# Patient Record
Sex: Female | Born: 1955 | Race: White | Hispanic: No | Marital: Married | Smoking: Current every day smoker
Health system: Southern US, Community
[De-identification: ages and names within clinical notes are randomized; demographics above are authoritative.]

## PROBLEM LIST (undated history)

## (undated) DIAGNOSIS — F1990 Other psychoactive substance use, unspecified, uncomplicated: Secondary | ICD-10-CM

## (undated) DIAGNOSIS — I10 Essential (primary) hypertension: Secondary | ICD-10-CM

## (undated) DIAGNOSIS — F32A Depression, unspecified: Secondary | ICD-10-CM

## (undated) DIAGNOSIS — M1990 Unspecified osteoarthritis, unspecified site: Secondary | ICD-10-CM

## (undated) DIAGNOSIS — F319 Bipolar disorder, unspecified: Secondary | ICD-10-CM

## (undated) DIAGNOSIS — Z720 Tobacco use: Secondary | ICD-10-CM

## (undated) DIAGNOSIS — I639 Cerebral infarction, unspecified: Secondary | ICD-10-CM

## (undated) DIAGNOSIS — E119 Type 2 diabetes mellitus without complications: Secondary | ICD-10-CM

## (undated) DIAGNOSIS — G8929 Other chronic pain: Secondary | ICD-10-CM

## (undated) DIAGNOSIS — E785 Hyperlipidemia, unspecified: Secondary | ICD-10-CM

## (undated) DIAGNOSIS — M4802 Spinal stenosis, cervical region: Secondary | ICD-10-CM

## (undated) HISTORY — DX: Essential (primary) hypertension: I10

## (undated) HISTORY — DX: Bipolar disorder, unspecified: F31.9

## (undated) HISTORY — DX: Other psychoactive substance use, unspecified, uncomplicated: F19.90

## (undated) HISTORY — DX: Type 2 diabetes mellitus without complications: E11.9

## (undated) HISTORY — DX: Hyperlipidemia, unspecified: E78.5

## (undated) HISTORY — DX: Depression, unspecified: F32.A

## (undated) HISTORY — PX: CHOLECYSTECTOMY: SHX55

## (undated) HISTORY — DX: Cerebral infarction, unspecified: I63.9

## (undated) HISTORY — DX: Unspecified osteoarthritis, unspecified site: M19.90

## (undated) HISTORY — DX: Other chronic pain: G89.29

## (undated) HISTORY — DX: Tobacco use: Z72.0

## (undated) HISTORY — PX: VAGINAL HYSTERECTOMY: SUR661

## (undated) HISTORY — DX: Spinal stenosis, cervical region: M48.02

---

## 2016-01-02 ENCOUNTER — Other Ambulatory Visit (HOSPITAL_COMMUNITY): Payer: Self-pay | Admitting: Neurosurgery

## 2016-01-02 ENCOUNTER — Other Ambulatory Visit: Payer: Self-pay | Admitting: Neurosurgery

## 2016-01-02 DIAGNOSIS — M4722 Other spondylosis with radiculopathy, cervical region: Secondary | ICD-10-CM

## 2016-01-21 ENCOUNTER — Ambulatory Visit (HOSPITAL_COMMUNITY)
Admission: RE | Admit: 2016-01-21 | Discharge: 2016-01-21 | Disposition: A | Discharge status: Home / Self Care | Payer: BLUE CROSS/BLUE SHIELD | Source: Ambulatory Visit | Attending: Neurosurgery | Admitting: Neurosurgery

## 2016-01-21 ENCOUNTER — Encounter (HOSPITAL_COMMUNITY): Payer: Self-pay | Admitting: *Deleted

## 2016-01-21 DIAGNOSIS — G542 Cervical root disorders, not elsewhere classified: Secondary | ICD-10-CM | POA: Insufficient documentation

## 2016-01-21 DIAGNOSIS — M4722 Other spondylosis with radiculopathy, cervical region: Secondary | ICD-10-CM | POA: Diagnosis not present

## 2016-01-21 DIAGNOSIS — M50223 Other cervical disc displacement at C6-C7 level: Secondary | ICD-10-CM | POA: Diagnosis not present

## 2016-01-21 DIAGNOSIS — M4802 Spinal stenosis, cervical region: Secondary | ICD-10-CM | POA: Diagnosis not present

## 2016-01-21 MED ORDER — LIDOCAINE HCL (PF) 1 % IJ SOLN
10.0000 mL | Freq: Once | INTRAMUSCULAR | Status: AC
  Administered 2016-01-21: 5 mL via INTRADERMAL

## 2016-01-21 MED ORDER — DIAZEPAM 5 MG PO TABS
ORAL_TABLET | ORAL | Status: AC
  Filled 2016-01-21: qty 2

## 2016-01-21 MED ORDER — OXYCODONE HCL 5 MG PO TABS: 5.0000 mg | ORAL_TABLET | ORAL | Status: DC | PRN

## 2016-01-21 MED ORDER — DIAZEPAM 5 MG PO TABS
10.0000 mg | ORAL_TABLET | Freq: Once | ORAL | Status: AC
  Administered 2016-01-21: 10 mg via ORAL

## 2016-01-21 MED ORDER — ONDANSETRON HCL 4 MG/2ML IJ SOLN
4.0000 mg | Freq: Four times a day (QID) | INTRAMUSCULAR | Status: DC | PRN
Start: 2016-01-21 — End: 2016-01-22

## 2016-01-21 MED ORDER — LIDOCAINE HCL 1 % IJ SOLN
INTRAMUSCULAR | Status: AC
  Filled 2016-01-21: qty 10

## 2016-01-21 MED ORDER — IOHEXOL 300 MG/ML  SOLN
10.0000 mL | Freq: Once | INTRAMUSCULAR | Status: AC | PRN
  Administered 2016-01-21: 10 mL via INTRATHECAL

## 2016-01-21 NOTE — Op Note (Signed)
01/21/2016 Cervical Myelogram  PATIENT:  Teresa Garrison is a 60 y.o. female with cervical pain  PRE-OPERATIVE DIAGNOSIS:  cervicalgia  POST-OPERATIVE DIAGNOSIS:  cervicalgia  PROCEDURE:  Cervical Myelogram  SURGEON:  Skylinn Vialpando  ANESTHESIA:   local LOCAL MEDICATIONS USED:  LIDOCAINE  and Amount: 7 ml Procedure Note: [name] is a 60 y.o. female Was taken to the fluoroscopy suite and  positioned prone on the fluoroscopy table. Her back was prepared and draped in a sterile manner. I infiltrated 7 cc into the lumbar region. I then introduced a spinal needle into the thecal sac at the L3/4 interlaminar space. I infiltrated 10cc of Omnipaque 300 into the thecal sac. Fluoroscopy showed the needle and contrast in the thecal sac. Monico Blitz tolerated the procedure well. she Will be taken to CT for evaluation.     PATIENT DISPOSITION:  PACU - hemodynamically stable.

## 2016-01-21 NOTE — Discharge Instructions (Signed)
Myelography, Care After °These instructions give you information on caring for yourself after your procedure. Your doctor may also give you more specific instructions. Call your doctor if you have any problems or questions after your procedure. °HOME CARE °· Rest the first day. °· When you rest, lie flat, with your head slightly raised (elevated). °· Avoid heavy lifting and activity for 48 hours, or as told by your doctor. °· You may take the bandage (dressing) off one day after the test, or as told by your doctor. °· Take all medicines only as told by your doctor. °· Ask your doctor when it is okay to take a shower or bath. °· Ask your doctor when your test results will be ready and how you can get them. Make sure you follow up and get your results. °· Do not drink alcohol for 24 hours, or as told by your doctor. °· Drink enough fluid to keep your pee (urine) clear or pale yellow. °GET HELP IF:  °· You have a fever. °· You have a headache. °· You feel sick to your stomach (nauseous) or throw up (vomit). °· You have pain or cramping in your belly (abdomen). °GET HELP RIGHT AWAY IF:  °· You have a headache with a stiff neck or fever. °· You have trouble breathing. °· Any of the places where the needles were put in are: °¨ Puffy (swollen) or red. °¨ Sore or hot to the touch. °¨ Draining yellowish-white fluid (pus). °¨ Bleeding. °MAKE SURE YOU: °· Understand these instructions. °· Will watch your condition. °· Will get help right away if you are not doing well or get worse. °  °This information is not intended to replace advice given to you by your health care provider. Make sure you discuss any questions you have with your health care provider. °  °Document Released: 03/17/2008 Document Revised: 06/29/2014 Document Reviewed: 03/02/2012 °Elsevier Interactive Patient Education ©2016 Elsevier Inc. ° °

## 2017-04-08 IMAGING — CT CT CERVICAL SPINE W/ CM
3 of 4 series · 12 of 33 positions shown, 14 images · IV contrast (Omni 300)
Comparison: none

CLINICAL DATA: Neck and RIGHT arm pain.  Recent surgery.
TECHNIQUE: Contiguous axial images were obtained through the Cervical spine
after the intrathecal infusion of infusion. Coronal and sagittal
reconstructions were obtained of the axial image sets.

[Series 4: c-spine 2.0 (person_name) (person_name) · axial · 0.24mm/px · z∈[-454,-326]mm · 4 of 97 slices shown, 5 images]
[im 17/97  soft-tissue]
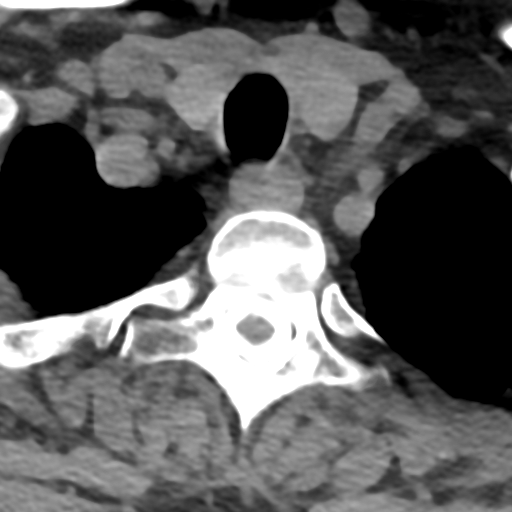
[im 17/97  bone]
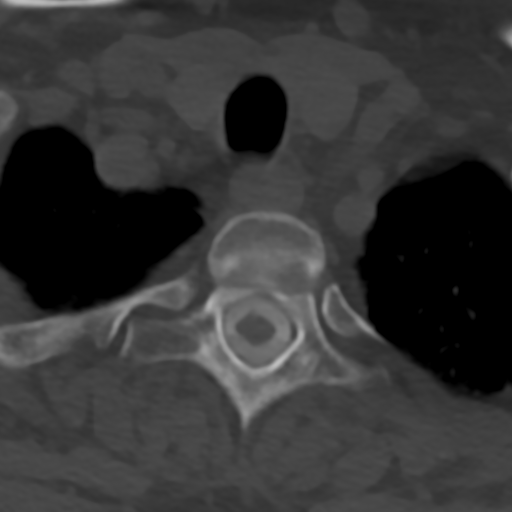
[im 33/97  bone]
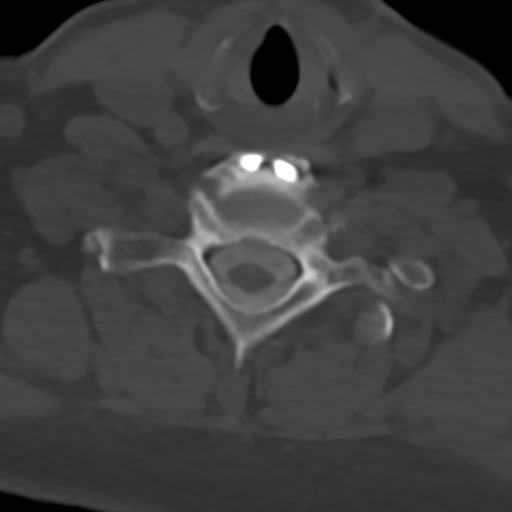
[im 65/97  bone]
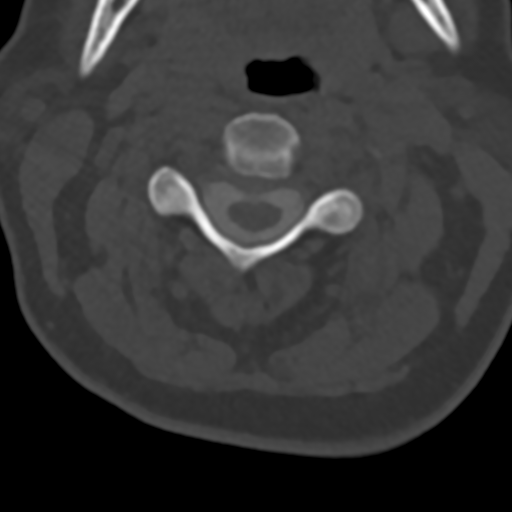
[im 81/97  bone]
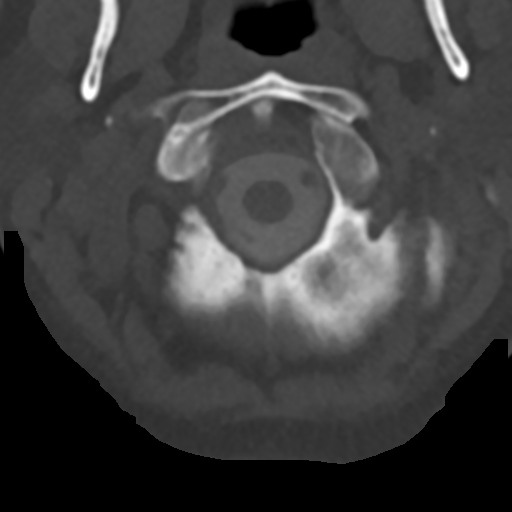

[Series 11: c-spine 2.0 cor bone · coronal · 0.28mm/px · 3 of 61 slices shown]
[im 13/61  bone]
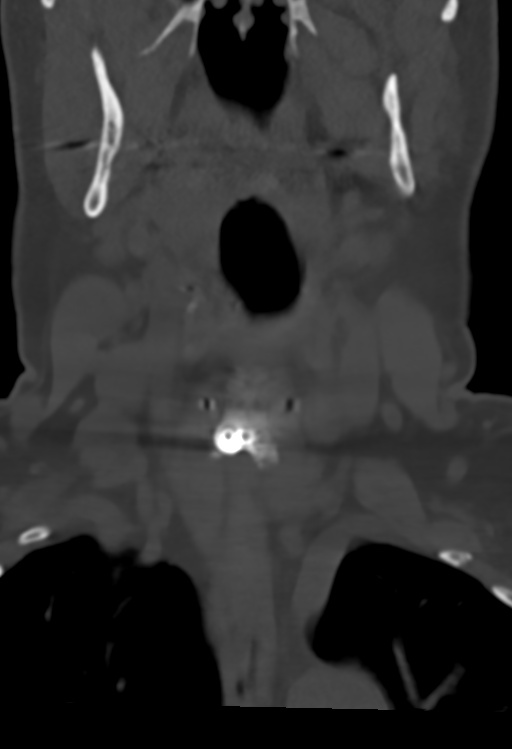
[im 25/61  bone]
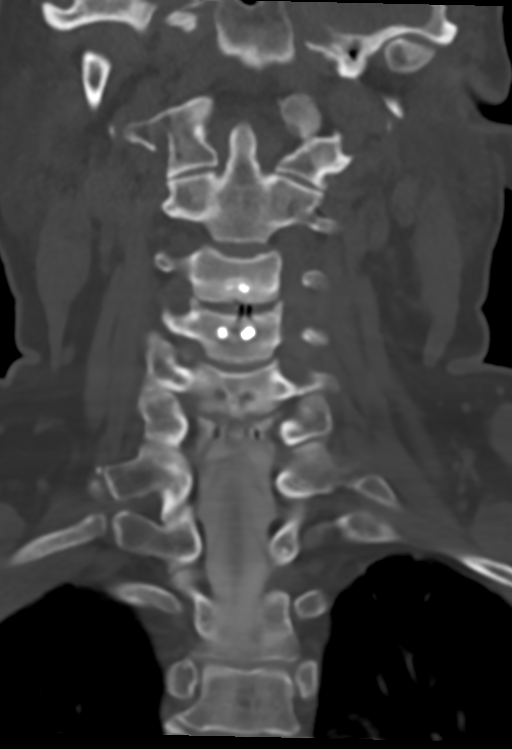
[im 37/61  bone]
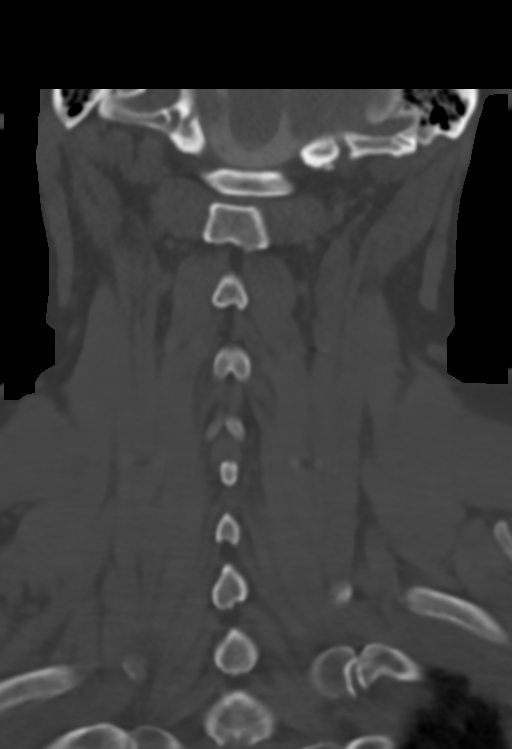

[Series 12: c-spine 2.0 sag bone · sagittal · 0.28mm/px · 5 of 50 slices shown, 6 images]
[im 17/50  bone]
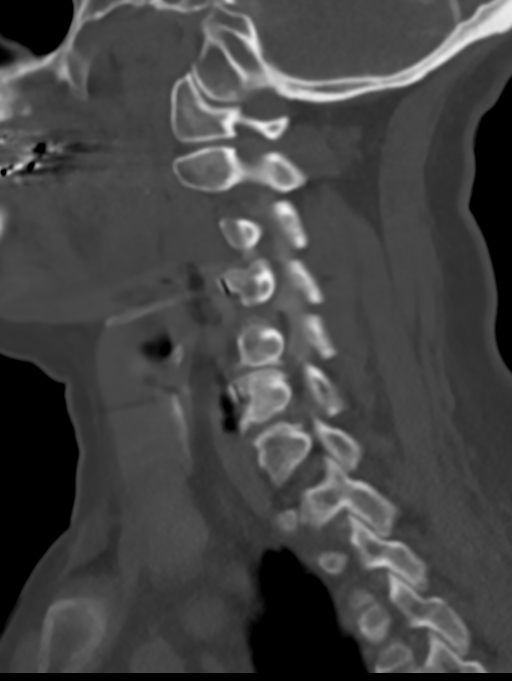
[im 21/50  bone]
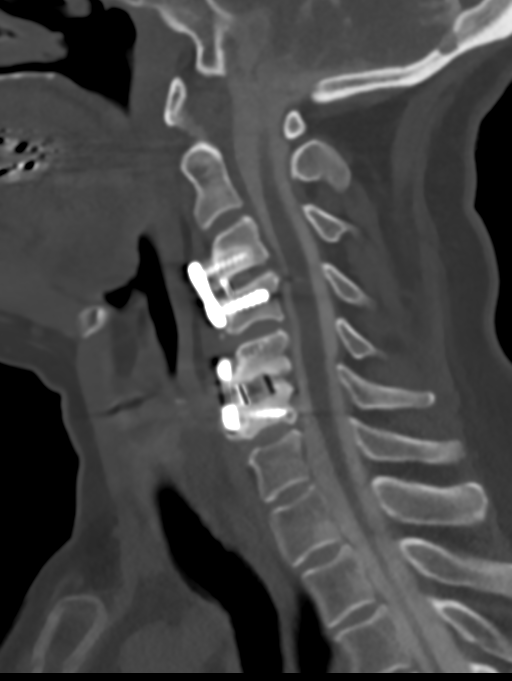
[im 25/50  soft-tissue]
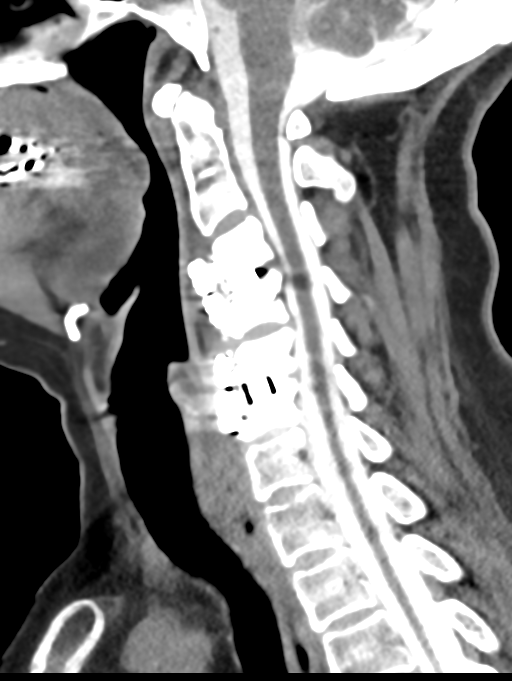
[im 25/50  bone]
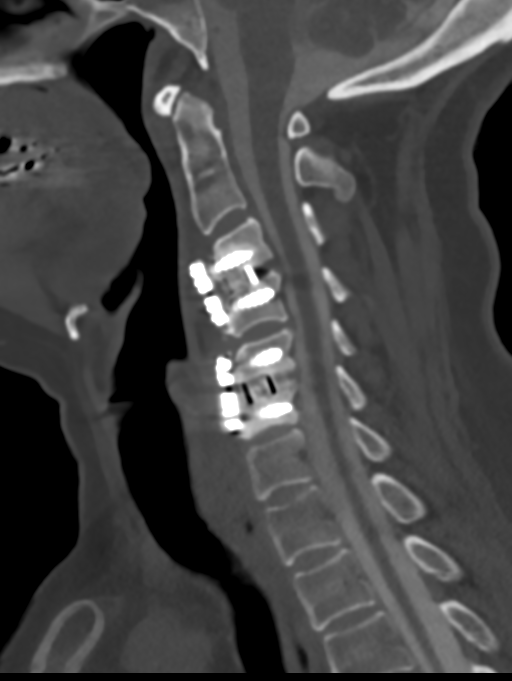
[im 29/50  bone]
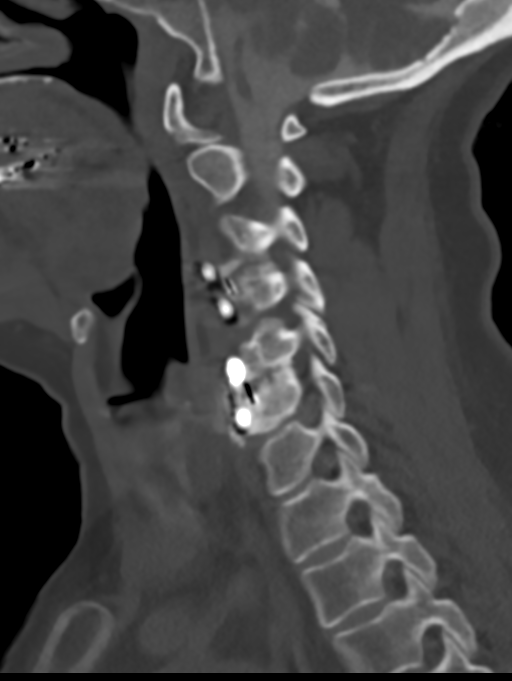
[im 33/50  bone]
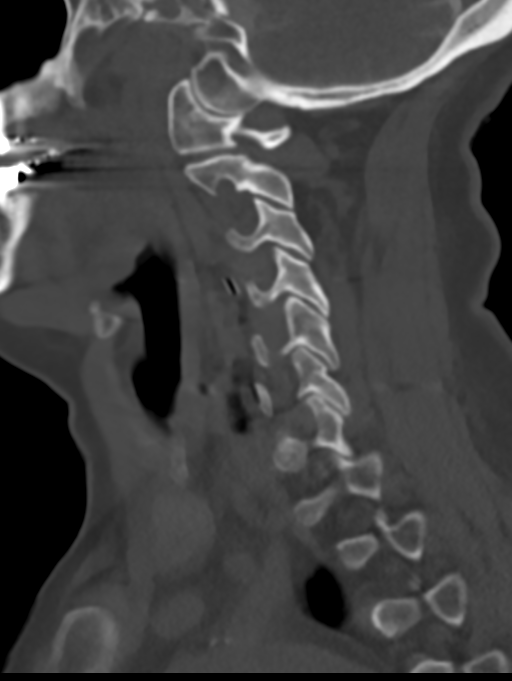

[12 of 33 positions shown; findings below may reference images not displayed]

FLUOROSCOPY TIME:  0.6 minutes corresponding to a Dose Area Product
of 277.64 ?Gy*m2

PROCEDURE:
LUMBAR PUNCTURE FOR CERVICAL MYELOGRAM

After thorough discussion of risks and benefits of the procedure
including bleeding, infection, injury to nerves, blood vessels,
adjacent structures as well as headache and CSF leak, written and
oral informed consent was obtained. Consent was obtained by Dr. Linder
propel We discussed the high likelihood of obtaining a diagnostic
study.

Injection was performed by the neurosurgeon. Needle was placed at
L3-4 LEFT paramedian approach.

I personally supervised acquisition of the myelogram images.
FINDINGS: CERVICAL MYELOGRAM FINDINGS:

The patient has undergone C3-4 and C5-6 ACDF with anterior plating.
There is reversal of the normal cervical lordotic curve. Ventral
defects at C3-4 and C5-6 are observed, resulting in mass effect on
the ventral cord at the C3-4 level. There is an asymmetric
extradural defect at C5-6 on the RIGHT compressing the C6 nerve
root. There is an asymmetric extradural defect at C6-7 on the LEFT
compressing the LEFT C7 nerve root.

CT CERVICAL MYELOGRAM FINDINGS:

Alignment: Reversal of the normal cervical lordotic curve. No
subluxation.

Vertebrae: No evidence for solid interbody or posterior arthrodesis
at either level.

Cord: Cord flattening is present at C3-4, to a lesser degree at
C4-5. None at C5-6.

Posterior Fossa: No tonsillar herniation

Vertebral Arteries: Not assessed

Paraspinal tissues: Unremarkable

Disc levels:

The individual disc spaces were examined as follows:

C2-3:  Unremarkable.

C3-4: Lucency surrounds both C3 screws. Cage height is maintained.
No solid interbody arthrodesis is observed. Slight uncinate spurring
on the RIGHT without definite C4 nerve root impingement. There is
ventral soft tissue at the interspace level, which in conjunction
with osseous ridging effaces the anterior subarachnoid space and
mildly flattens cord. Canal diameter is adequate, 9-10 mm.

C4-5: Focal central protrusion. This effaces the anterior
subarachnoid space, and results in slight cord flattening. No C5
nerve root impingement.

C5-6: Post fusion interspace. The RIGHT C5 screw breaches the cortex
superiorly at C5 and projects into the C4-5 interspace. There is
possible early loosening around both C6 screws. There is residual
uncinate spurring on the RIGHT, which in conjunction with loss of
interspace height, results in RIGHT C6 nerve root impingement.

C6-7: Soft disc extrusion on the RIGHT in the foramen. RIGHT C7
nerve root impingement.

C7-T1:  Unremarkable.
IMPRESSION: Large disc extrusion at C6-7 on the RIGHT. RIGHT C7 nerve root
impingement.

Residual uncinate spurring, with foraminal narrowing at C5-6 on the
RIGHT. RIGHT C6 nerve root impingement is observed.

No solid arthrodesis at C3-4 or C5-6. Moderate effacement of the
ventral subarachnoid space is noted at C3-4, more noticeable
myelographically, uncertain significance.

## 2018-05-01 DIAGNOSIS — R079 Chest pain, unspecified: Secondary | ICD-10-CM | POA: Diagnosis not present

## 2018-05-02 DIAGNOSIS — R079 Chest pain, unspecified: Secondary | ICD-10-CM | POA: Diagnosis not present

## 2020-03-14 DIAGNOSIS — Z6825 Body mass index (BMI) 25.0-25.9, adult: Secondary | ICD-10-CM

## 2020-03-14 DIAGNOSIS — R030 Elevated blood-pressure reading, without diagnosis of hypertension: Secondary | ICD-10-CM | POA: Insufficient documentation

## 2020-03-14 HISTORY — DX: Body mass index (BMI) 25.0-25.9, adult: Z68.25

## 2020-03-14 HISTORY — DX: Elevated blood-pressure reading, without diagnosis of hypertension: R03.0

## 2020-03-25 DIAGNOSIS — M25519 Pain in unspecified shoulder: Secondary | ICD-10-CM | POA: Insufficient documentation

## 2020-03-25 HISTORY — DX: Pain in unspecified shoulder: M25.519

## 2020-10-25 ENCOUNTER — Encounter: Payer: Self-pay | Admitting: Internal Medicine

## 2020-10-25 DIAGNOSIS — I639 Cerebral infarction, unspecified: Secondary | ICD-10-CM | POA: Diagnosis not present

## 2020-10-25 DIAGNOSIS — I340 Nonrheumatic mitral (valve) insufficiency: Secondary | ICD-10-CM | POA: Diagnosis not present

## 2020-10-26 DIAGNOSIS — I639 Cerebral infarction, unspecified: Secondary | ICD-10-CM | POA: Diagnosis not present

## 2020-11-28 ENCOUNTER — Ambulatory Visit: Payer: Medicare HMO | Admitting: Cardiology

## 2020-11-28 ENCOUNTER — Other Ambulatory Visit: Payer: Self-pay

## 2020-11-28 ENCOUNTER — Telehealth: Payer: Self-pay

## 2020-11-28 VITALS — BP 122/80 | HR 69 | Ht 66.0 in | Wt 164.6 lb

## 2020-11-28 DIAGNOSIS — D518 Other vitamin B12 deficiency anemias: Secondary | ICD-10-CM

## 2020-11-28 DIAGNOSIS — K219 Gastro-esophageal reflux disease without esophagitis: Secondary | ICD-10-CM | POA: Insufficient documentation

## 2020-11-28 DIAGNOSIS — M545 Low back pain, unspecified: Secondary | ICD-10-CM | POA: Insufficient documentation

## 2020-11-28 DIAGNOSIS — E1165 Type 2 diabetes mellitus with hyperglycemia: Secondary | ICD-10-CM | POA: Insufficient documentation

## 2020-11-28 DIAGNOSIS — F319 Bipolar disorder, unspecified: Secondary | ICD-10-CM | POA: Insufficient documentation

## 2020-11-28 DIAGNOSIS — M159 Polyosteoarthritis, unspecified: Secondary | ICD-10-CM

## 2020-11-28 DIAGNOSIS — R41840 Attention and concentration deficit: Secondary | ICD-10-CM

## 2020-11-28 DIAGNOSIS — I6523 Occlusion and stenosis of bilateral carotid arteries: Secondary | ICD-10-CM | POA: Insufficient documentation

## 2020-11-28 DIAGNOSIS — K5900 Constipation, unspecified: Secondary | ICD-10-CM | POA: Insufficient documentation

## 2020-11-28 DIAGNOSIS — F330 Major depressive disorder, recurrent, mild: Secondary | ICD-10-CM | POA: Insufficient documentation

## 2020-11-28 DIAGNOSIS — R5383 Other fatigue: Secondary | ICD-10-CM

## 2020-11-28 DIAGNOSIS — D72829 Elevated white blood cell count, unspecified: Secondary | ICD-10-CM

## 2020-11-28 DIAGNOSIS — R1311 Dysphagia, oral phase: Secondary | ICD-10-CM | POA: Insufficient documentation

## 2020-11-28 DIAGNOSIS — I700 Atherosclerosis of aorta: Secondary | ICD-10-CM

## 2020-11-28 DIAGNOSIS — E782 Mixed hyperlipidemia: Secondary | ICD-10-CM | POA: Insufficient documentation

## 2020-11-28 DIAGNOSIS — I10 Essential (primary) hypertension: Secondary | ICD-10-CM | POA: Insufficient documentation

## 2020-11-28 DIAGNOSIS — E785 Hyperlipidemia, unspecified: Secondary | ICD-10-CM | POA: Insufficient documentation

## 2020-11-28 DIAGNOSIS — M150 Primary generalized (osteo)arthritis: Secondary | ICD-10-CM

## 2020-11-28 DIAGNOSIS — Z720 Tobacco use: Secondary | ICD-10-CM

## 2020-11-28 DIAGNOSIS — Z79899 Other long term (current) drug therapy: Secondary | ICD-10-CM | POA: Insufficient documentation

## 2020-11-28 DIAGNOSIS — Z01810 Encounter for preprocedural cardiovascular examination: Secondary | ICD-10-CM

## 2020-11-28 DIAGNOSIS — I714 Abdominal aortic aneurysm, without rupture, unspecified: Secondary | ICD-10-CM | POA: Insufficient documentation

## 2020-11-28 DIAGNOSIS — F411 Generalized anxiety disorder: Secondary | ICD-10-CM

## 2020-11-28 DIAGNOSIS — E042 Nontoxic multinodular goiter: Secondary | ICD-10-CM | POA: Insufficient documentation

## 2020-11-28 DIAGNOSIS — E039 Hypothyroidism, unspecified: Secondary | ICD-10-CM

## 2020-11-28 DIAGNOSIS — E559 Vitamin D deficiency, unspecified: Secondary | ICD-10-CM | POA: Insufficient documentation

## 2020-11-28 DIAGNOSIS — I209 Angina pectoris, unspecified: Secondary | ICD-10-CM

## 2020-11-28 DIAGNOSIS — I739 Peripheral vascular disease, unspecified: Secondary | ICD-10-CM | POA: Insufficient documentation

## 2020-11-28 DIAGNOSIS — F32A Depression, unspecified: Secondary | ICD-10-CM | POA: Insufficient documentation

## 2020-11-28 DIAGNOSIS — Z8673 Personal history of transient ischemic attack (TIA), and cerebral infarction without residual deficits: Secondary | ICD-10-CM

## 2020-11-28 DIAGNOSIS — M542 Cervicalgia: Secondary | ICD-10-CM | POA: Insufficient documentation

## 2020-11-28 DIAGNOSIS — G43019 Migraine without aura, intractable, without status migrainosus: Secondary | ICD-10-CM

## 2020-11-28 DIAGNOSIS — M1990 Unspecified osteoarthritis, unspecified site: Secondary | ICD-10-CM | POA: Insufficient documentation

## 2020-11-28 DIAGNOSIS — F1990 Other psychoactive substance use, unspecified, uncomplicated: Secondary | ICD-10-CM | POA: Insufficient documentation

## 2020-11-28 DIAGNOSIS — I639 Cerebral infarction, unspecified: Secondary | ICD-10-CM

## 2020-11-28 DIAGNOSIS — M4802 Spinal stenosis, cervical region: Secondary | ICD-10-CM | POA: Insufficient documentation

## 2020-11-28 DIAGNOSIS — E119 Type 2 diabetes mellitus without complications: Secondary | ICD-10-CM | POA: Insufficient documentation

## 2020-11-28 HISTORY — DX: Other vitamin B12 deficiency anemias: D51.8

## 2020-11-28 HISTORY — DX: Generalized anxiety disorder: F41.1

## 2020-11-28 HISTORY — DX: Atherosclerosis of aorta: I70.0

## 2020-11-28 HISTORY — DX: Encounter for preprocedural cardiovascular examination: Z01.810

## 2020-11-28 HISTORY — DX: Mixed hyperlipidemia: E78.2

## 2020-11-28 HISTORY — DX: Primary generalized (osteo)arthritis: M15.0

## 2020-11-28 HISTORY — DX: Other fatigue: R53.83

## 2020-11-28 HISTORY — DX: Low back pain, unspecified: M54.50

## 2020-11-28 HISTORY — DX: Dysphagia, oral phase: R13.11

## 2020-11-28 HISTORY — DX: Gastro-esophageal reflux disease without esophagitis: K21.9

## 2020-11-28 HISTORY — DX: Major depressive disorder, recurrent, mild: F33.0

## 2020-11-28 HISTORY — DX: Elevated white blood cell count, unspecified: D72.829

## 2020-11-28 HISTORY — DX: Constipation, unspecified: K59.00

## 2020-11-28 HISTORY — DX: Angina pectoris, unspecified: I20.9

## 2020-11-28 HISTORY — DX: Hypothyroidism, unspecified: E03.9

## 2020-11-28 HISTORY — DX: Personal history of transient ischemic attack (TIA), and cerebral infarction without residual deficits: Z86.73

## 2020-11-28 HISTORY — DX: Cerebral infarction, unspecified: I63.9

## 2020-11-28 HISTORY — DX: Migraine without aura, intractable, without status migrainosus: G43.019

## 2020-11-28 HISTORY — DX: Nontoxic multinodular goiter: E04.2

## 2020-11-28 HISTORY — DX: Occlusion and stenosis of bilateral carotid arteries: I65.23

## 2020-11-28 HISTORY — DX: Peripheral vascular disease, unspecified: I73.9

## 2020-11-28 HISTORY — DX: Attention and concentration deficit: R41.840

## 2020-11-28 HISTORY — DX: Type 2 diabetes mellitus with hyperglycemia: E11.65

## 2020-11-28 HISTORY — DX: Other long term (current) drug therapy: Z79.899

## 2020-11-28 HISTORY — DX: Vitamin D deficiency, unspecified: E55.9

## 2020-11-28 HISTORY — DX: Abdominal aortic aneurysm, without rupture, unspecified: I71.40

## 2020-11-28 HISTORY — DX: Polyosteoarthritis, unspecified: M15.9

## 2020-11-28 NOTE — Telephone Encounter (Signed)
Pt currently has appt today with Dr. Tomie China today as new pt.

## 2020-11-28 NOTE — Patient Instructions (Signed)
Medication Instructions:  No medication changes. *If you need a refill on your cardiac medications before your next appointment, please call your pharmacy*   Lab Work: None ordered If you have labs (blood work) drawn today and your tests are completely normal, you will receive your results only by: MyChart Message (if you have MyChart) OR A paper copy in the mail If you have any lab test that is abnormal or we need to change your treatment, we will call you to review the results.   Testing/Procedures: Your physician has requested that you have a stress echocardiogram. For further information please visit [email or web address] Please follow instruction sheet as given.    Follow-Up: At [facility] [place], you and your health needs are our priority.  As part of our continuing mission to provide you with exceptional heart care, we have created designated Provider Care Teams.  These Care Teams include your primary Cardiologist (physician) and Advanced Practice Providers (APPs -  Physician Assistants and Nurse Practitioners) who all work together to provide you with the care you need, when you need it.  We recommend signing up for the patient portal called "MyChart".  Sign up information is provided on this After Visit Summary.  MyChart is used to connect with patients for Virtual Visits (Telemedicine).  Patients are able to view lab/test results, encounter notes, upcoming appointments, etc.  Non-urgent messages can be sent to your provider as well.   To learn more about what you can do with MyChart, go to [email or web address].    Your next appointment:   6 month(s)  The format for your next appointment:   In Person  Provider:   Belva Crome, MD   Other Instructions Exercise Stress Echocardiogram An exercise stress echocardiogram is a test to check how well your heart is working. This test uses sound waves and a computer to make pictures of your heart. These pictures will be taken  before and after you exercise. For this test, you will walk on a treadmill or ride a bicycle to make your heart beat faster. While you exercise, your heart will be checked with an electrocardiogram (ECG). Your blood pressure will also be checked. You may have this test if: You have chest pain or a heart problem. You had a heart attack or heart surgery not long ago. You have heart valve problems. You have a condition that causes narrowing of the blood vessels that supply your heart. You have a high risk of heart disease and: You are starting a new exercise program. You need to have a big surgery. Tell a doctor about: Any allergies you have. All medicines you are taking. This includes vitamins, herbs, eye drops, creams, and over-the-counter medicines. Any problems you or family members have had with medicines that make you fall asleep (anesthetic medicines). Any surgeries you have had. Any blood disorders you have. Any medical conditions you have. Whether you are pregnant or may be pregnant. What are the risks? Generally, this is a safe test. However, problems may occur, including: Chest pain. Feeling dizzy or light-headed. Shortness of breath. Increased or irregular heartbeat. Feeling like you may vomit (nausea) or vomiting. Heart attack. This is very rare. What happens before the test? Medicines Ask your doctor about changing or stopping your normal medicines. This is important if you take diabetes medicines or blood thinners. If you use an inhaler, bring it to the test. General instructions Wear comfortable clothes and walking shoes. Follow instructions from your doctor about what you cannot eat or  drink before the test. Do not drink or eat anything that has caffeine in it. Stop having caffeine 24 hours before the test. Do not smoke or use products that contain nicotine or tobacco for 4 hours before the test. If you need help quitting, ask your doctor. What happens during the  test? You will take off your clothes from the waist up and put on a hospital gown. Electrodes or patches will be put on your chest. A blood pressure cuff will be put on your arm. Before you exercise, a computer will make a picture of your heart. To do this: You will lie down and a gel will be put on your chest. A wand will be moved over the gel. Sound waves from the wand will go to the computer to make the picture. Then, you will start to exercise. You may walk on a treadmill or pedal a bicycle. Your blood pressure and heart rhythm will be checked while you exercise. The exercise will get harder or faster. You will exercise until: Your heart reaches a certain level. You are too tired to go on. You cannot go on because of chest pain, weakness, or dizziness. You will lie down right away so another picture of your heart can be taken. The procedure may vary among doctors and hospitals.   What can I expect after the test? After your test, it is common to have: Mild soreness. Mild tiredness. Your heart rate and blood pressure will be checked until they return to your normal levels. You should not have any new symptoms after this test. Follow these instructions at home: If your doctor says that you can, you may: Eat what you normally eat. Do your normal activities. Take over-the-counter and prescription medicines only as told by your doctor. Keep all follow-up visits. It is up to you to get the results of your test. Ask how to get your results when they are ready. Contact a doctor if: You feel dizzy or light-headed. You have a fast or irregular heartbeat. You feel like you may vomit or you vomit. You have a headache. You feel short of breath. Get help right away if: You develop pain or pressure: In your chest. In your jaw or neck. Between your shoulders. That goes down your left arm. You faint. You have trouble breathing. These symptoms may be an emergency. Get medical help right  away. Call your local emergency services (911 in the [place] Do not wait to see if the symptoms will go away. Do not drive yourself to the hospital. Summary This is a test that checks how well your heart is working. Follow instructions about what you cannot eat or drink before the test. Ask your doctor if you should take your normal medicines before the test. Stop having caffeine 24 hours before the test. Do not smoke or use products with nicotine or tobacco in them for 4 hours before the test. During the test, your blood pressure and heart rhythm will be checked while you exercise. This information is not intended to replace advice given to you by your health care provider. Make sure you discuss any questions you have with your health care provider. Document Revised: 01/30/2020 Document Reviewed: 01/30/2020 Elsevier Patient Education  2021 [employer].

## 2020-11-28 NOTE — Telephone Encounter (Signed)
   [place] Pre-operative Risk Assessment    Patient Name: Teresa Garrison  DOB: 08/03/1955  MRN: [id number]   HEARTCARE STAFF: - Please ensure there is not already an duplicate clearance open for this procedure. - Under Visit Info/Reason for Call, type in Other and utilize the format Clearance MM/DD/YY or Clearance TBD. Do not use dashes or single digits. - If request is for dental extraction, please clarify the # of teeth to be extracted. - If the patient is currently at the dentist's office, call Pre-Op APP to address. If the patient is not currently in the dentist office, please route to the Pre-Op pool  Request for surgical clearance:  What type of surgery is being performed? ACDFC6-7 w/ plate   When is this surgery scheduled? 01/07/21   What type of clearance is required (medical clearance vs. Pharmacy clearance to hold med vs. Both)? Medical  Are there any medications that need to be held prior to surgery and how long?   Practice name and name of physician performing surgery? [facility] Dr. Rennis Garrison   What is the office phone number? [phone]   7.   What is the office fax number? [phone]  8.   Anesthesia type (None, local, MAC, general) ? General   Teresa Garrison Teresa Garrison 11/28/2020, 1:57 PM  _________________________________________________________________   (provider comments below)

## 2020-11-28 NOTE — Progress Notes (Signed)
Cardiology Office Note:    Date:  11/28/2020   ID:  Teresa Garrison, DOB 10-22-1955, MRN [id number]  PCP:  Galvin Proffer, MD  Cardiologist:  Garwin Brothers, MD   Referring MD: Galvin Proffer, MD    ASSESSMENT:    1. Mixed hyperlipidemia   2. Pre-operative cardiovascular examination   3. Tobacco abuse   4. History of stroke    PLAN:    In order of problems listed above:  Primary prevention stressed with the patient.  Importance of compliance with diet medication stressed and she vocalized understanding.  She walks on a regular basis but for about 10 to 15 minutes. Preoperative risk stratification: I reviewed [place] hospital records.  Echocardiogram revealed preserved ejection fraction.  I will set him up for an exercise stress echo because she tells me that she walks some on a regular basis.  If this test is negative then she is not at high risk for coronary events during the aforementioned surgery.  Medical hemodynamic monitoring will further reduce risk of coronary events. Mixed dyslipidemia: Diet was emphasized.  Lipids managed by primary care.  She is on statin therapy.  Lifestyle modification urged. Cigarette smoking: I spent 5 minutes with the patient discussing solely about smoking. Smoking cessation was counseled. I suggested to the patient also different medications and pharmacological interventions. Patient is keen to try stopping on its own at this time. He will get back to me if he needs any further assistance in this matter. Patient will be seen in follow-up appointment in 6 months or earlier if the patient has any concerns.  I reviewed the CT scan report done recently at [facility] hospital and there is no abdominal aortic aneurysm that is mentioned in the report.    Medication Adjustments/Labs and Tests Ordered: Current medicines are reviewed at length with the patient today.  Concerns regarding medicines are outlined above.  No orders of the defined types were placed  in this encounter.  No orders of the defined types were placed in this encounter.    No chief complaint on file.    History of Present Illness:    Teresa Garrison is a 65 y.o. female.  Patient has past medical history of mixed dyslipidemia and smoking.  She also has history of diabetes mellitus.  She was evaluated for stroke recently.  I reviewed [place] hospital records extensively.  She tells me that she walks some on a regular basis.  She is planning to undergo orthopedic surgery on her neck and therefore she is sent for preoperative evaluation.  She denies any history of heart attack.  At the time of my evaluation, the patient is alert awake oriented and in no distress.  No chest pain orthopnea or PND.  Past Medical History:  Diagnosis Date   Arthritis    Bipolar disorder (HCC)    Cervical stenosis of spine    Chronic neck pain    CVA (cerebral vascular accident) (HCC)    Depression    Diabetes (HCC)    Hyperlipidemia    Hypertension    Substance use disorder    Tobacco abuse     Past Surgical History:  Procedure Laterality Date   CHOLECYSTECTOMY     VAGINAL HYSTERECTOMY      Current Medications: Current Meds  Medication Sig   aspirin EC 81 MG tablet Take 81 mg by mouth daily.   atomoxetine (STRATTERA) 25 MG capsule Take 25 mg by mouth daily.   atorvastatin (  LIPITOR) 80 MG tablet Take 80 mg by mouth daily.   diazepam (VALIUM) 10 MG tablet Take 10 mg by mouth 2 (two) times daily as needed for anxiety.   DULoxetine (CYMBALTA) 60 MG capsule Take 60 mg by mouth daily.   ibuprofen (ADVIL) 600 MG tablet Take 600 mg by mouth every 6 (six) hours as needed for pain.   metFORMIN (GLUCOPHAGE-XR) 500 MG 24 hr tablet Take 500 mg by mouth daily.   pantoprazole (PROTONIX) 40 MG tablet Take 40 mg by mouth daily.   traZODone (DESYREL) 50 MG tablet Take 50-75 mg by mouth at bedtime as needed for sleep.   valACYclovir (VALTREX) 1000 MG tablet Take 1,000 mg by mouth daily as needed for  other. Fever blisters     Allergies:   Patient has no known allergies.   Social History   Socioeconomic History   Marital status: Married    Spouse name: Not on file   Number of children: Not on file   Years of education: Not on file   Highest education level: Not on file  Occupational History   Not on file  Tobacco Use   Smoking status: Every Day    Pack years: 0.00    Types: Cigarettes   Smokeless tobacco: Not on file  Substance and Sexual Activity   Alcohol use: Not on file   Drug use: Not on file   Sexual activity: Not on file  Other Topics Concern   Not on file  Social History Narrative   Not on file   Social Determinants of Health   Financial Resource Strain: Not on file  Food Insecurity: Not on file  Transportation Needs: Not on file  Physical Activity: Not on file  Stress: Not on file  Social Connections: Not on file     Family History: The patient's family history includes Cancer in an other family member; Diabetes in her sister; Hypertension in an other family member; Stroke in an other family member.  ROS:   Please see the history of present illness.    All other systems reviewed and are negative.  EKGs/Labs/Other Studies Reviewed:    The following studies were reviewed today: I reviewed findings from [facility].  EKG reveals sinus rhythm and nonspecific ST-T changes and echo was unremarkable.   Recent Labs: No results found for requested labs within last 8760 hours.  Recent Lipid Panel No results found for: CHOL, TRIG, HDL, CHOLHDL, VLDL, LDLCALC, LDLDIRECT  Physical Exam:    VS:  BP 122/80   Pulse 69   Ht 5\' 6"  (1.676 m)   Wt 164 lb 9.6 oz (74.7 kg)   SpO2 95%   BMI 26.57 kg/m     Wt Readings from Last 3 Encounters:  11/28/20 164 lb 9.6 oz (74.7 kg)  01/21/16 173 lb (78.5 kg)     GEN: Patient is in no acute distress HEENT: Normal NECK: No JVD; No carotid bruits LYMPHATICS: No lymphadenopathy CARDIAC: Hear sounds regular,  2/6 systolic murmur at the apex. RESPIRATORY:  Clear to auscultation without rales, wheezing or rhonchi  ABDOMEN: Soft, non-tender, non-distended MUSCULOSKELETAL:  No edema; No deformity  SKIN: Warm and dry NEUROLOGIC:  Alert and oriented x 3 PSYCHIATRIC:  Normal affect   Signed, 03/22/16, MD  11/28/2020 2:46 PM    [facility]

## 2020-11-28 NOTE — Telephone Encounter (Signed)
Primary Cardiologist:None  Chart reviewed as part of pre-operative protocol coverage. Because of Teresa Garrison past medical history and time since last visit, he/she will require a follow-up visit in order to better assess preoperative cardiovascular risk.  Pre-op covering staff: - Please schedule appointment and call patient to inform them. - Please contact requesting surgeon's office via preferred method (i.e, phone, fax) to inform them of need for appointment prior to surgery.  If applicable, this message will also be routed to pharmacy pool and/or primary cardiologist for input on holding anticoagulant/antiplatelet agent as requested below so that this information is available at time of patient's appointment.   Ronney Asters, NP  11/28/2020, 2:11 PM

## 2020-11-29 ENCOUNTER — Telehealth: Payer: Self-pay | Admitting: Cardiology

## 2020-11-29 NOTE — Telephone Encounter (Signed)
    Pt is calling to schedule her stress echo today at the hospital.

## 2020-11-29 NOTE — Telephone Encounter (Signed)
Advised pt still waiting on pre cert.

## 2020-12-02 DIAGNOSIS — Z01810 Encounter for preprocedural cardiovascular examination: Secondary | ICD-10-CM | POA: Diagnosis not present

## 2020-12-02 DIAGNOSIS — R0602 Shortness of breath: Secondary | ICD-10-CM | POA: Diagnosis not present

## 2020-12-03 NOTE — Telephone Encounter (Signed)
    Teresa Garrison DOB:  1955/07/31  MRN:  [id number]   Primary Cardiologist: Dr. Josiah Lobo  Chart reviewed as part of pre-operative protocol coverage. Pt was seen by Dr. Josiah Lobo 11/28/20 at which time the plan was to undergo a stress echocardiogram. This was performed at [facility]. Per Dr. Josiah Lobo, the patients stress test was negative therefore he is at acceptable risk to proceed with surgical procedure.   I will route this recommendation to the requesting party via Epic fax function and remove from pre-op pool.  Please call with questions.  Georgie Chard, NP 12/03/2020, 9:33 AM

## 2020-12-03 NOTE — Telephone Encounter (Signed)
Normal stress echo yesterday at [facility] per Dr. Tomie China.

## 2020-12-04 ENCOUNTER — Telehealth: Payer: Self-pay | Admitting: Cardiology

## 2020-12-04 NOTE — Telephone Encounter (Signed)
Received fax from [facility] stating the patient's clearance had been sent to the wrong place.  Called and got the correct fax number for the [facility] and have resent them. Patient is aware and that we have resent the clearance.

## 2020-12-04 NOTE — Telephone Encounter (Signed)
Spoke with pt and advised that the clearance has been sent to her Dr. Noel Gerold as the fax number provided earlier was incorrect.

## 2020-12-04 NOTE — Telephone Encounter (Signed)
PT is returning a call 

## 2022-06-16 DIAGNOSIS — I10 Essential (primary) hypertension: Secondary | ICD-10-CM | POA: Diagnosis not present

## 2022-06-18 DIAGNOSIS — E559 Vitamin D deficiency, unspecified: Secondary | ICD-10-CM | POA: Diagnosis not present

## 2022-06-18 DIAGNOSIS — E038 Other specified hypothyroidism: Secondary | ICD-10-CM | POA: Diagnosis not present

## 2022-06-18 DIAGNOSIS — M150 Primary generalized (osteo)arthritis: Secondary | ICD-10-CM | POA: Diagnosis not present

## 2022-06-18 DIAGNOSIS — M151 Heberden's nodes (with arthropathy): Secondary | ICD-10-CM | POA: Diagnosis not present

## 2022-06-18 DIAGNOSIS — I25119 Atherosclerotic heart disease of native coronary artery with unspecified angina pectoris: Secondary | ICD-10-CM | POA: Diagnosis not present

## 2022-06-18 DIAGNOSIS — E1165 Type 2 diabetes mellitus with hyperglycemia: Secondary | ICD-10-CM | POA: Diagnosis not present

## 2022-06-18 DIAGNOSIS — D518 Other vitamin B12 deficiency anemias: Secondary | ICD-10-CM | POA: Diagnosis not present

## 2022-06-18 DIAGNOSIS — E782 Mixed hyperlipidemia: Secondary | ICD-10-CM | POA: Diagnosis not present

## 2022-06-18 DIAGNOSIS — F330 Major depressive disorder, recurrent, mild: Secondary | ICD-10-CM | POA: Diagnosis not present

## 2022-06-19 DIAGNOSIS — F419 Anxiety disorder, unspecified: Secondary | ICD-10-CM | POA: Diagnosis not present

## 2022-06-24 DIAGNOSIS — R1012 Left upper quadrant pain: Secondary | ICD-10-CM | POA: Diagnosis not present

## 2022-06-24 DIAGNOSIS — E1165 Type 2 diabetes mellitus with hyperglycemia: Secondary | ICD-10-CM | POA: Diagnosis not present

## 2022-06-24 DIAGNOSIS — E559 Vitamin D deficiency, unspecified: Secondary | ICD-10-CM | POA: Diagnosis not present

## 2022-06-24 DIAGNOSIS — R41840 Attention and concentration deficit: Secondary | ICD-10-CM | POA: Diagnosis not present

## 2022-06-24 DIAGNOSIS — I25119 Atherosclerotic heart disease of native coronary artery with unspecified angina pectoris: Secondary | ICD-10-CM | POA: Diagnosis not present

## 2022-06-24 DIAGNOSIS — Z79899 Other long term (current) drug therapy: Secondary | ICD-10-CM | POA: Diagnosis not present

## 2022-06-24 DIAGNOSIS — K219 Gastro-esophageal reflux disease without esophagitis: Secondary | ICD-10-CM | POA: Diagnosis not present

## 2022-06-24 DIAGNOSIS — F330 Major depressive disorder, recurrent, mild: Secondary | ICD-10-CM | POA: Diagnosis not present

## 2022-06-24 DIAGNOSIS — M151 Heberden's nodes (with arthropathy): Secondary | ICD-10-CM | POA: Diagnosis not present

## 2022-06-24 DIAGNOSIS — D518 Other vitamin B12 deficiency anemias: Secondary | ICD-10-CM | POA: Diagnosis not present

## 2022-06-24 DIAGNOSIS — E038 Other specified hypothyroidism: Secondary | ICD-10-CM | POA: Diagnosis not present

## 2022-06-24 DIAGNOSIS — M150 Primary generalized (osteo)arthritis: Secondary | ICD-10-CM | POA: Diagnosis not present

## 2022-06-24 DIAGNOSIS — E782 Mixed hyperlipidemia: Secondary | ICD-10-CM | POA: Diagnosis not present

## 2022-06-25 DIAGNOSIS — R1010 Upper abdominal pain, unspecified: Secondary | ICD-10-CM | POA: Diagnosis not present

## 2022-07-09 DIAGNOSIS — I25119 Atherosclerotic heart disease of native coronary artery with unspecified angina pectoris: Secondary | ICD-10-CM | POA: Diagnosis not present

## 2022-07-09 DIAGNOSIS — D518 Other vitamin B12 deficiency anemias: Secondary | ICD-10-CM | POA: Diagnosis not present

## 2022-07-09 DIAGNOSIS — E782 Mixed hyperlipidemia: Secondary | ICD-10-CM | POA: Diagnosis not present

## 2022-07-09 DIAGNOSIS — F330 Major depressive disorder, recurrent, mild: Secondary | ICD-10-CM | POA: Diagnosis not present

## 2022-07-09 DIAGNOSIS — E1165 Type 2 diabetes mellitus with hyperglycemia: Secondary | ICD-10-CM | POA: Diagnosis not present

## 2022-07-09 DIAGNOSIS — E038 Other specified hypothyroidism: Secondary | ICD-10-CM | POA: Diagnosis not present

## 2022-07-09 DIAGNOSIS — M151 Heberden's nodes (with arthropathy): Secondary | ICD-10-CM | POA: Diagnosis not present

## 2022-07-09 DIAGNOSIS — E559 Vitamin D deficiency, unspecified: Secondary | ICD-10-CM | POA: Diagnosis not present

## 2022-07-09 DIAGNOSIS — M150 Primary generalized (osteo)arthritis: Secondary | ICD-10-CM | POA: Diagnosis not present

## 2022-07-10 DIAGNOSIS — F411 Generalized anxiety disorder: Secondary | ICD-10-CM | POA: Diagnosis not present

## 2022-07-17 DIAGNOSIS — I10 Essential (primary) hypertension: Secondary | ICD-10-CM | POA: Diagnosis not present

## 2022-08-12 DIAGNOSIS — D518 Other vitamin B12 deficiency anemias: Secondary | ICD-10-CM | POA: Diagnosis not present

## 2022-08-12 DIAGNOSIS — M150 Primary generalized (osteo)arthritis: Secondary | ICD-10-CM | POA: Diagnosis not present

## 2022-08-12 DIAGNOSIS — E782 Mixed hyperlipidemia: Secondary | ICD-10-CM | POA: Diagnosis not present

## 2022-08-12 DIAGNOSIS — I25119 Atherosclerotic heart disease of native coronary artery with unspecified angina pectoris: Secondary | ICD-10-CM | POA: Diagnosis not present

## 2022-08-12 DIAGNOSIS — M151 Heberden's nodes (with arthropathy): Secondary | ICD-10-CM | POA: Diagnosis not present

## 2022-08-12 DIAGNOSIS — E559 Vitamin D deficiency, unspecified: Secondary | ICD-10-CM | POA: Diagnosis not present

## 2022-08-12 DIAGNOSIS — E1165 Type 2 diabetes mellitus with hyperglycemia: Secondary | ICD-10-CM | POA: Diagnosis not present

## 2022-08-12 DIAGNOSIS — F330 Major depressive disorder, recurrent, mild: Secondary | ICD-10-CM | POA: Diagnosis not present

## 2022-08-12 DIAGNOSIS — E038 Other specified hypothyroidism: Secondary | ICD-10-CM | POA: Diagnosis not present

## 2022-08-17 DIAGNOSIS — I10 Essential (primary) hypertension: Secondary | ICD-10-CM | POA: Diagnosis not present

## 2022-08-28 DIAGNOSIS — F419 Anxiety disorder, unspecified: Secondary | ICD-10-CM | POA: Diagnosis not present

## 2022-08-31 DIAGNOSIS — M150 Primary generalized (osteo)arthritis: Secondary | ICD-10-CM | POA: Diagnosis not present

## 2022-08-31 DIAGNOSIS — F330 Major depressive disorder, recurrent, mild: Secondary | ICD-10-CM | POA: Diagnosis not present

## 2022-08-31 DIAGNOSIS — M151 Heberden's nodes (with arthropathy): Secondary | ICD-10-CM | POA: Diagnosis not present

## 2022-08-31 DIAGNOSIS — E038 Other specified hypothyroidism: Secondary | ICD-10-CM | POA: Diagnosis not present

## 2022-08-31 DIAGNOSIS — E782 Mixed hyperlipidemia: Secondary | ICD-10-CM | POA: Diagnosis not present

## 2022-08-31 DIAGNOSIS — E559 Vitamin D deficiency, unspecified: Secondary | ICD-10-CM | POA: Diagnosis not present

## 2022-08-31 DIAGNOSIS — E1165 Type 2 diabetes mellitus with hyperglycemia: Secondary | ICD-10-CM | POA: Diagnosis not present

## 2022-08-31 DIAGNOSIS — F411 Generalized anxiety disorder: Secondary | ICD-10-CM | POA: Diagnosis not present

## 2022-08-31 DIAGNOSIS — D518 Other vitamin B12 deficiency anemias: Secondary | ICD-10-CM | POA: Diagnosis not present

## 2022-09-15 DIAGNOSIS — I10 Essential (primary) hypertension: Secondary | ICD-10-CM | POA: Diagnosis not present

## 2022-09-23 DIAGNOSIS — E1165 Type 2 diabetes mellitus with hyperglycemia: Secondary | ICD-10-CM | POA: Diagnosis not present

## 2022-09-23 DIAGNOSIS — R0602 Shortness of breath: Secondary | ICD-10-CM | POA: Diagnosis not present

## 2022-09-23 DIAGNOSIS — E782 Mixed hyperlipidemia: Secondary | ICD-10-CM | POA: Diagnosis not present

## 2022-09-23 DIAGNOSIS — F330 Major depressive disorder, recurrent, mild: Secondary | ICD-10-CM | POA: Diagnosis not present

## 2022-09-23 DIAGNOSIS — D518 Other vitamin B12 deficiency anemias: Secondary | ICD-10-CM | POA: Diagnosis not present

## 2022-09-23 DIAGNOSIS — E038 Other specified hypothyroidism: Secondary | ICD-10-CM | POA: Diagnosis not present

## 2022-09-23 DIAGNOSIS — I25119 Atherosclerotic heart disease of native coronary artery with unspecified angina pectoris: Secondary | ICD-10-CM | POA: Diagnosis not present

## 2022-09-23 DIAGNOSIS — Z79899 Other long term (current) drug therapy: Secondary | ICD-10-CM | POA: Diagnosis not present

## 2022-09-23 DIAGNOSIS — M150 Primary generalized (osteo)arthritis: Secondary | ICD-10-CM | POA: Diagnosis not present

## 2022-09-23 DIAGNOSIS — E559 Vitamin D deficiency, unspecified: Secondary | ICD-10-CM | POA: Diagnosis not present

## 2022-09-23 DIAGNOSIS — K219 Gastro-esophageal reflux disease without esophagitis: Secondary | ICD-10-CM | POA: Diagnosis not present

## 2022-09-23 DIAGNOSIS — Z0000 Encounter for general adult medical examination without abnormal findings: Secondary | ICD-10-CM | POA: Diagnosis not present

## 2022-09-24 DIAGNOSIS — R011 Cardiac murmur, unspecified: Secondary | ICD-10-CM | POA: Diagnosis not present

## 2022-09-25 DIAGNOSIS — E559 Vitamin D deficiency, unspecified: Secondary | ICD-10-CM | POA: Diagnosis not present

## 2022-09-25 DIAGNOSIS — F330 Major depressive disorder, recurrent, mild: Secondary | ICD-10-CM | POA: Diagnosis not present

## 2022-09-25 DIAGNOSIS — M151 Heberden's nodes (with arthropathy): Secondary | ICD-10-CM | POA: Diagnosis not present

## 2022-09-25 DIAGNOSIS — E782 Mixed hyperlipidemia: Secondary | ICD-10-CM | POA: Diagnosis not present

## 2022-09-25 DIAGNOSIS — E1165 Type 2 diabetes mellitus with hyperglycemia: Secondary | ICD-10-CM | POA: Diagnosis not present

## 2022-09-25 DIAGNOSIS — M150 Primary generalized (osteo)arthritis: Secondary | ICD-10-CM | POA: Diagnosis not present

## 2022-09-25 DIAGNOSIS — D518 Other vitamin B12 deficiency anemias: Secondary | ICD-10-CM | POA: Diagnosis not present

## 2022-09-25 DIAGNOSIS — E038 Other specified hypothyroidism: Secondary | ICD-10-CM | POA: Diagnosis not present

## 2022-09-25 DIAGNOSIS — F411 Generalized anxiety disorder: Secondary | ICD-10-CM | POA: Diagnosis not present

## 2022-09-28 DIAGNOSIS — I6523 Occlusion and stenosis of bilateral carotid arteries: Secondary | ICD-10-CM | POA: Diagnosis not present

## 2022-09-28 DIAGNOSIS — R0989 Other specified symptoms and signs involving the circulatory and respiratory systems: Secondary | ICD-10-CM | POA: Diagnosis not present

## 2022-09-30 DIAGNOSIS — E559 Vitamin D deficiency, unspecified: Secondary | ICD-10-CM | POA: Diagnosis not present

## 2022-09-30 DIAGNOSIS — I70223 Atherosclerosis of native arteries of extremities with rest pain, bilateral legs: Secondary | ICD-10-CM | POA: Diagnosis not present

## 2022-09-30 DIAGNOSIS — E782 Mixed hyperlipidemia: Secondary | ICD-10-CM | POA: Diagnosis not present

## 2022-09-30 DIAGNOSIS — E1165 Type 2 diabetes mellitus with hyperglycemia: Secondary | ICD-10-CM | POA: Diagnosis not present

## 2022-10-01 DIAGNOSIS — F419 Anxiety disorder, unspecified: Secondary | ICD-10-CM | POA: Diagnosis not present

## 2022-10-06 DIAGNOSIS — E042 Nontoxic multinodular goiter: Secondary | ICD-10-CM | POA: Diagnosis not present

## 2022-10-06 DIAGNOSIS — R221 Localized swelling, mass and lump, neck: Secondary | ICD-10-CM | POA: Diagnosis not present

## 2022-10-09 DIAGNOSIS — I77811 Abdominal aortic ectasia: Secondary | ICD-10-CM | POA: Diagnosis not present

## 2022-10-13 DIAGNOSIS — R2232 Localized swelling, mass and lump, left upper limb: Secondary | ICD-10-CM | POA: Diagnosis not present

## 2022-10-16 DIAGNOSIS — R590 Localized enlarged lymph nodes: Secondary | ICD-10-CM | POA: Diagnosis not present

## 2022-10-17 DIAGNOSIS — I10 Essential (primary) hypertension: Secondary | ICD-10-CM | POA: Diagnosis not present

## 2022-11-16 DIAGNOSIS — I10 Essential (primary) hypertension: Secondary | ICD-10-CM | POA: Diagnosis not present

## 2022-11-20 DIAGNOSIS — E038 Other specified hypothyroidism: Secondary | ICD-10-CM | POA: Diagnosis not present

## 2022-11-20 DIAGNOSIS — E559 Vitamin D deficiency, unspecified: Secondary | ICD-10-CM | POA: Diagnosis not present

## 2022-11-20 DIAGNOSIS — E782 Mixed hyperlipidemia: Secondary | ICD-10-CM | POA: Diagnosis not present

## 2022-11-20 DIAGNOSIS — D518 Other vitamin B12 deficiency anemias: Secondary | ICD-10-CM | POA: Diagnosis not present

## 2022-11-20 DIAGNOSIS — F330 Major depressive disorder, recurrent, mild: Secondary | ICD-10-CM | POA: Diagnosis not present

## 2022-11-20 DIAGNOSIS — M151 Heberden's nodes (with arthropathy): Secondary | ICD-10-CM | POA: Diagnosis not present

## 2022-11-20 DIAGNOSIS — E1165 Type 2 diabetes mellitus with hyperglycemia: Secondary | ICD-10-CM | POA: Diagnosis not present

## 2022-11-20 DIAGNOSIS — F411 Generalized anxiety disorder: Secondary | ICD-10-CM | POA: Diagnosis not present

## 2022-11-20 DIAGNOSIS — M150 Primary generalized (osteo)arthritis: Secondary | ICD-10-CM | POA: Diagnosis not present

## 2023-02-24 ENCOUNTER — Other Ambulatory Visit: Payer: Self-pay | Admitting: Physician Assistant

## 2023-02-24 DIAGNOSIS — M5412 Radiculopathy, cervical region: Secondary | ICD-10-CM

## 2023-02-24 DIAGNOSIS — M542 Cervicalgia: Secondary | ICD-10-CM

## 2023-02-24 DIAGNOSIS — M4322 Fusion of spine, cervical region: Secondary | ICD-10-CM

## 2023-03-11 ENCOUNTER — Encounter: Payer: Self-pay | Admitting: Physician Assistant

## 2023-03-15 ENCOUNTER — Ambulatory Visit
Admission: RE | Admit: 2023-03-15 | Discharge: 2023-03-15 | Disposition: A | Discharge status: Home / Self Care | Payer: Medicare HMO | Source: Ambulatory Visit | Attending: Physician Assistant | Admitting: Physician Assistant

## 2023-03-15 DIAGNOSIS — M542 Cervicalgia: Secondary | ICD-10-CM

## 2023-03-15 DIAGNOSIS — M4322 Fusion of spine, cervical region: Secondary | ICD-10-CM

## 2023-03-15 DIAGNOSIS — M5412 Radiculopathy, cervical region: Secondary | ICD-10-CM

## 2023-07-08 DIAGNOSIS — R1110 Vomiting, unspecified: Secondary | ICD-10-CM | POA: Diagnosis not present

## 2023-07-08 DIAGNOSIS — M791 Myalgia, unspecified site: Secondary | ICD-10-CM | POA: Diagnosis not present

## 2023-07-08 DIAGNOSIS — H9203 Otalgia, bilateral: Secondary | ICD-10-CM | POA: Diagnosis not present

## 2023-07-12 DIAGNOSIS — F419 Anxiety disorder, unspecified: Secondary | ICD-10-CM | POA: Diagnosis not present

## 2023-07-15 DIAGNOSIS — F319 Bipolar disorder, unspecified: Secondary | ICD-10-CM | POA: Diagnosis not present

## 2023-07-15 DIAGNOSIS — I10 Essential (primary) hypertension: Secondary | ICD-10-CM | POA: Diagnosis not present

## 2023-07-15 DIAGNOSIS — Z8673 Personal history of transient ischemic attack (TIA), and cerebral infarction without residual deficits: Secondary | ICD-10-CM | POA: Diagnosis not present

## 2023-07-15 DIAGNOSIS — G9389 Other specified disorders of brain: Secondary | ICD-10-CM | POA: Diagnosis not present

## 2023-07-15 DIAGNOSIS — M316 Other giant cell arteritis: Secondary | ICD-10-CM | POA: Diagnosis not present

## 2023-07-15 DIAGNOSIS — Z7952 Long term (current) use of systemic steroids: Secondary | ICD-10-CM | POA: Diagnosis not present

## 2023-07-15 DIAGNOSIS — F17210 Nicotine dependence, cigarettes, uncomplicated: Secondary | ICD-10-CM | POA: Diagnosis not present

## 2023-07-15 DIAGNOSIS — R531 Weakness: Secondary | ICD-10-CM | POA: Diagnosis not present

## 2023-07-15 DIAGNOSIS — G453 Amaurosis fugax: Secondary | ICD-10-CM | POA: Diagnosis not present

## 2023-07-15 DIAGNOSIS — R040 Epistaxis: Secondary | ICD-10-CM | POA: Diagnosis not present

## 2023-07-15 DIAGNOSIS — I4510 Unspecified right bundle-branch block: Secondary | ICD-10-CM | POA: Diagnosis not present

## 2023-07-15 DIAGNOSIS — A0811 Acute gastroenteropathy due to Norwalk agent: Secondary | ICD-10-CM | POA: Diagnosis not present

## 2023-07-15 DIAGNOSIS — Z7982 Long term (current) use of aspirin: Secondary | ICD-10-CM | POA: Diagnosis not present

## 2023-07-15 DIAGNOSIS — Z7984 Long term (current) use of oral hypoglycemic drugs: Secondary | ICD-10-CM | POA: Diagnosis not present

## 2023-07-15 DIAGNOSIS — E7800 Pure hypercholesterolemia, unspecified: Secondary | ICD-10-CM | POA: Diagnosis not present

## 2023-07-19 DIAGNOSIS — I10 Essential (primary) hypertension: Secondary | ICD-10-CM | POA: Diagnosis not present

## 2023-07-20 DIAGNOSIS — F411 Generalized anxiety disorder: Secondary | ICD-10-CM | POA: Diagnosis not present

## 2023-07-20 DIAGNOSIS — I25119 Atherosclerotic heart disease of native coronary artery with unspecified angina pectoris: Secondary | ICD-10-CM | POA: Diagnosis not present

## 2023-07-20 DIAGNOSIS — M150 Primary generalized (osteo)arthritis: Secondary | ICD-10-CM | POA: Diagnosis not present

## 2023-07-20 DIAGNOSIS — D518 Other vitamin B12 deficiency anemias: Secondary | ICD-10-CM | POA: Diagnosis not present

## 2023-07-20 DIAGNOSIS — F330 Major depressive disorder, recurrent, mild: Secondary | ICD-10-CM | POA: Diagnosis not present

## 2023-07-20 DIAGNOSIS — E782 Mixed hyperlipidemia: Secondary | ICD-10-CM | POA: Diagnosis not present

## 2023-07-20 DIAGNOSIS — E1165 Type 2 diabetes mellitus with hyperglycemia: Secondary | ICD-10-CM | POA: Diagnosis not present

## 2023-07-20 DIAGNOSIS — E038 Other specified hypothyroidism: Secondary | ICD-10-CM | POA: Diagnosis not present

## 2023-07-20 DIAGNOSIS — E559 Vitamin D deficiency, unspecified: Secondary | ICD-10-CM | POA: Diagnosis not present

## 2023-07-20 DIAGNOSIS — G43019 Migraine without aura, intractable, without status migrainosus: Secondary | ICD-10-CM | POA: Diagnosis not present

## 2023-07-20 DIAGNOSIS — M151 Heberden's nodes (with arthropathy): Secondary | ICD-10-CM | POA: Diagnosis not present

## 2023-07-20 DIAGNOSIS — I700 Atherosclerosis of aorta: Secondary | ICD-10-CM | POA: Diagnosis not present

## 2023-07-22 DIAGNOSIS — M151 Heberden's nodes (with arthropathy): Secondary | ICD-10-CM | POA: Diagnosis not present

## 2023-07-22 DIAGNOSIS — Z0000 Encounter for general adult medical examination without abnormal findings: Secondary | ICD-10-CM | POA: Diagnosis not present

## 2023-07-22 DIAGNOSIS — E559 Vitamin D deficiency, unspecified: Secondary | ICD-10-CM | POA: Diagnosis not present

## 2023-07-22 DIAGNOSIS — D519 Vitamin B12 deficiency anemia, unspecified: Secondary | ICD-10-CM | POA: Diagnosis not present

## 2023-07-22 DIAGNOSIS — E038 Other specified hypothyroidism: Secondary | ICD-10-CM | POA: Diagnosis not present

## 2023-07-22 DIAGNOSIS — K219 Gastro-esophageal reflux disease without esophagitis: Secondary | ICD-10-CM | POA: Diagnosis not present

## 2023-07-22 DIAGNOSIS — I639 Cerebral infarction, unspecified: Secondary | ICD-10-CM | POA: Diagnosis not present

## 2023-07-22 DIAGNOSIS — E1165 Type 2 diabetes mellitus with hyperglycemia: Secondary | ICD-10-CM | POA: Diagnosis not present

## 2023-07-22 DIAGNOSIS — E782 Mixed hyperlipidemia: Secondary | ICD-10-CM | POA: Diagnosis not present

## 2023-07-22 DIAGNOSIS — M150 Primary generalized (osteo)arthritis: Secondary | ICD-10-CM | POA: Diagnosis not present

## 2023-07-22 DIAGNOSIS — F330 Major depressive disorder, recurrent, mild: Secondary | ICD-10-CM | POA: Diagnosis not present

## 2023-07-22 DIAGNOSIS — M4322 Fusion of spine, cervical region: Secondary | ICD-10-CM | POA: Diagnosis not present

## 2023-07-22 DIAGNOSIS — M5412 Radiculopathy, cervical region: Secondary | ICD-10-CM | POA: Diagnosis not present

## 2023-07-22 DIAGNOSIS — Z79899 Other long term (current) drug therapy: Secondary | ICD-10-CM | POA: Diagnosis not present

## 2023-07-22 DIAGNOSIS — M791 Myalgia, unspecified site: Secondary | ICD-10-CM | POA: Diagnosis not present

## 2023-07-27 DIAGNOSIS — M316 Other giant cell arteritis: Secondary | ICD-10-CM | POA: Diagnosis not present

## 2023-07-27 DIAGNOSIS — H532 Diplopia: Secondary | ICD-10-CM | POA: Diagnosis not present

## 2023-07-27 DIAGNOSIS — H53121 Transient visual loss, right eye: Secondary | ICD-10-CM | POA: Diagnosis not present

## 2023-07-27 DIAGNOSIS — R519 Headache, unspecified: Secondary | ICD-10-CM | POA: Diagnosis not present

## 2023-08-02 DIAGNOSIS — H538 Other visual disturbances: Secondary | ICD-10-CM | POA: Diagnosis not present

## 2023-08-03 DIAGNOSIS — G43019 Migraine without aura, intractable, without status migrainosus: Secondary | ICD-10-CM | POA: Diagnosis not present

## 2023-08-03 DIAGNOSIS — I700 Atherosclerosis of aorta: Secondary | ICD-10-CM | POA: Diagnosis not present

## 2023-08-03 DIAGNOSIS — E782 Mixed hyperlipidemia: Secondary | ICD-10-CM | POA: Diagnosis not present

## 2023-08-03 DIAGNOSIS — E1165 Type 2 diabetes mellitus with hyperglycemia: Secondary | ICD-10-CM | POA: Diagnosis not present

## 2023-08-03 DIAGNOSIS — M150 Primary generalized (osteo)arthritis: Secondary | ICD-10-CM | POA: Diagnosis not present

## 2023-08-03 DIAGNOSIS — M151 Heberden's nodes (with arthropathy): Secondary | ICD-10-CM | POA: Diagnosis not present

## 2023-08-03 DIAGNOSIS — F330 Major depressive disorder, recurrent, mild: Secondary | ICD-10-CM | POA: Diagnosis not present

## 2023-08-03 DIAGNOSIS — F411 Generalized anxiety disorder: Secondary | ICD-10-CM | POA: Diagnosis not present

## 2023-08-03 DIAGNOSIS — H04123 Dry eye syndrome of bilateral lacrimal glands: Secondary | ICD-10-CM | POA: Diagnosis not present

## 2023-08-03 DIAGNOSIS — E559 Vitamin D deficiency, unspecified: Secondary | ICD-10-CM | POA: Diagnosis not present

## 2023-08-03 DIAGNOSIS — I25119 Atherosclerotic heart disease of native coronary artery with unspecified angina pectoris: Secondary | ICD-10-CM | POA: Diagnosis not present

## 2023-08-03 DIAGNOSIS — E038 Other specified hypothyroidism: Secondary | ICD-10-CM | POA: Diagnosis not present

## 2023-08-03 DIAGNOSIS — D518 Other vitamin B12 deficiency anemias: Secondary | ICD-10-CM | POA: Diagnosis not present

## 2023-08-03 DIAGNOSIS — R519 Headache, unspecified: Secondary | ICD-10-CM | POA: Diagnosis not present

## 2023-08-12 DIAGNOSIS — F419 Anxiety disorder, unspecified: Secondary | ICD-10-CM | POA: Diagnosis not present

## 2023-08-19 DIAGNOSIS — I10 Essential (primary) hypertension: Secondary | ICD-10-CM | POA: Diagnosis not present

## 2023-08-27 ENCOUNTER — Telehealth: Payer: Self-pay | Admitting: Cardiology

## 2023-08-27 NOTE — Telephone Encounter (Signed)
 Called patient and she reported that for the past few weeks she had been having multiple episodes of light headedness, SOB and chest pain that radiated up into her jaw. She had not checked her blood pressure recently and was not able to at this time. Based on her symptoms I recommended that she go to the ER to be evaluated. Patient stated that she did not want to go to the hospital. I explained that at the ER she could be seen today and probably have the test done today and get some answers. Patient reluctantly stated that she would go to the ER and she had no further questions at this time.

## 2023-08-27 NOTE — Telephone Encounter (Signed)
 Patient c/o Palpitations:  STAT if patient reporting lightheadedness, shortness of breath, or chest pain  How long have you had palpitations/irregular HR/ Afib? Are you having the symptoms now? Several weeks, yes  Are you currently experiencing lightheadedness, SOB or CP? Lightheadedness, sob, cp  Do you have a history of afib (atrial fibrillation) or irregular heart rhythm? Not that she knows of  Have you checked your BP or HR? (document readings if available): no  Are you experiencing any other symptoms? no

## 2023-09-09 DIAGNOSIS — F419 Anxiety disorder, unspecified: Secondary | ICD-10-CM | POA: Diagnosis not present

## 2023-09-20 DIAGNOSIS — D518 Other vitamin B12 deficiency anemias: Secondary | ICD-10-CM | POA: Diagnosis not present

## 2023-09-20 DIAGNOSIS — I25119 Atherosclerotic heart disease of native coronary artery with unspecified angina pectoris: Secondary | ICD-10-CM | POA: Diagnosis not present

## 2023-09-20 DIAGNOSIS — M151 Heberden's nodes (with arthropathy): Secondary | ICD-10-CM | POA: Diagnosis not present

## 2023-09-20 DIAGNOSIS — E782 Mixed hyperlipidemia: Secondary | ICD-10-CM | POA: Diagnosis not present

## 2023-09-20 DIAGNOSIS — I700 Atherosclerosis of aorta: Secondary | ICD-10-CM | POA: Diagnosis not present

## 2023-09-20 DIAGNOSIS — E038 Other specified hypothyroidism: Secondary | ICD-10-CM | POA: Diagnosis not present

## 2023-09-20 DIAGNOSIS — F330 Major depressive disorder, recurrent, mild: Secondary | ICD-10-CM | POA: Diagnosis not present

## 2023-09-20 DIAGNOSIS — G43019 Migraine without aura, intractable, without status migrainosus: Secondary | ICD-10-CM | POA: Diagnosis not present

## 2023-09-20 DIAGNOSIS — E1165 Type 2 diabetes mellitus with hyperglycemia: Secondary | ICD-10-CM | POA: Diagnosis not present

## 2023-09-20 DIAGNOSIS — M150 Primary generalized (osteo)arthritis: Secondary | ICD-10-CM | POA: Diagnosis not present

## 2023-09-20 DIAGNOSIS — E559 Vitamin D deficiency, unspecified: Secondary | ICD-10-CM | POA: Diagnosis not present

## 2023-09-20 DIAGNOSIS — F411 Generalized anxiety disorder: Secondary | ICD-10-CM | POA: Diagnosis not present

## 2023-09-24 DIAGNOSIS — E782 Mixed hyperlipidemia: Secondary | ICD-10-CM | POA: Diagnosis not present

## 2023-09-24 DIAGNOSIS — E1165 Type 2 diabetes mellitus with hyperglycemia: Secondary | ICD-10-CM | POA: Diagnosis not present

## 2023-09-24 DIAGNOSIS — D518 Other vitamin B12 deficiency anemias: Secondary | ICD-10-CM | POA: Diagnosis not present

## 2023-09-24 DIAGNOSIS — M150 Primary generalized (osteo)arthritis: Secondary | ICD-10-CM | POA: Diagnosis not present

## 2023-09-24 DIAGNOSIS — E559 Vitamin D deficiency, unspecified: Secondary | ICD-10-CM | POA: Diagnosis not present

## 2023-09-24 DIAGNOSIS — I700 Atherosclerosis of aorta: Secondary | ICD-10-CM | POA: Diagnosis not present

## 2023-09-24 DIAGNOSIS — G43019 Migraine without aura, intractable, without status migrainosus: Secondary | ICD-10-CM | POA: Diagnosis not present

## 2023-09-24 DIAGNOSIS — F411 Generalized anxiety disorder: Secondary | ICD-10-CM | POA: Diagnosis not present

## 2023-09-24 DIAGNOSIS — I25119 Atherosclerotic heart disease of native coronary artery with unspecified angina pectoris: Secondary | ICD-10-CM | POA: Diagnosis not present

## 2023-09-24 DIAGNOSIS — M151 Heberden's nodes (with arthropathy): Secondary | ICD-10-CM | POA: Diagnosis not present

## 2023-09-24 DIAGNOSIS — F330 Major depressive disorder, recurrent, mild: Secondary | ICD-10-CM | POA: Diagnosis not present

## 2023-09-24 DIAGNOSIS — E038 Other specified hypothyroidism: Secondary | ICD-10-CM | POA: Diagnosis not present

## 2023-10-04 ENCOUNTER — Ambulatory Visit: Attending: Cardiology | Admitting: Cardiology

## 2023-10-11 DIAGNOSIS — F419 Anxiety disorder, unspecified: Secondary | ICD-10-CM | POA: Diagnosis not present

## 2023-10-14 DIAGNOSIS — Z8673 Personal history of transient ischemic attack (TIA), and cerebral infarction without residual deficits: Secondary | ICD-10-CM | POA: Diagnosis not present

## 2023-10-14 DIAGNOSIS — R0683 Snoring: Secondary | ICD-10-CM | POA: Diagnosis not present

## 2023-10-14 DIAGNOSIS — G478 Other sleep disorders: Secondary | ICD-10-CM | POA: Diagnosis not present

## 2023-10-14 DIAGNOSIS — R002 Palpitations: Secondary | ICD-10-CM | POA: Diagnosis not present

## 2023-10-14 DIAGNOSIS — G43009 Migraine without aura, not intractable, without status migrainosus: Secondary | ICD-10-CM | POA: Diagnosis not present

## 2023-10-17 DIAGNOSIS — I10 Essential (primary) hypertension: Secondary | ICD-10-CM | POA: Diagnosis not present

## 2023-11-01 DIAGNOSIS — E079 Disorder of thyroid, unspecified: Secondary | ICD-10-CM | POA: Diagnosis not present

## 2023-11-01 DIAGNOSIS — R071 Chest pain on breathing: Secondary | ICD-10-CM | POA: Diagnosis not present

## 2023-11-01 DIAGNOSIS — E782 Mixed hyperlipidemia: Secondary | ICD-10-CM | POA: Diagnosis not present

## 2023-11-01 DIAGNOSIS — G4700 Insomnia, unspecified: Secondary | ICD-10-CM | POA: Diagnosis not present

## 2023-11-01 DIAGNOSIS — E1165 Type 2 diabetes mellitus with hyperglycemia: Secondary | ICD-10-CM | POA: Diagnosis not present

## 2023-11-01 DIAGNOSIS — M150 Primary generalized (osteo)arthritis: Secondary | ICD-10-CM | POA: Diagnosis not present

## 2023-11-01 DIAGNOSIS — Z79899 Other long term (current) drug therapy: Secondary | ICD-10-CM | POA: Diagnosis not present

## 2023-11-01 DIAGNOSIS — D519 Vitamin B12 deficiency anemia, unspecified: Secondary | ICD-10-CM | POA: Diagnosis not present

## 2023-11-01 DIAGNOSIS — E559 Vitamin D deficiency, unspecified: Secondary | ICD-10-CM | POA: Diagnosis not present

## 2023-11-01 DIAGNOSIS — E042 Nontoxic multinodular goiter: Secondary | ICD-10-CM | POA: Diagnosis not present

## 2023-11-01 DIAGNOSIS — R0602 Shortness of breath: Secondary | ICD-10-CM | POA: Diagnosis not present

## 2023-11-01 DIAGNOSIS — I5022 Chronic systolic (congestive) heart failure: Secondary | ICD-10-CM | POA: Diagnosis not present

## 2023-11-10 DIAGNOSIS — F419 Anxiety disorder, unspecified: Secondary | ICD-10-CM | POA: Diagnosis not present

## 2023-11-10 DIAGNOSIS — Z8673 Personal history of transient ischemic attack (TIA), and cerebral infarction without residual deficits: Secondary | ICD-10-CM | POA: Diagnosis not present

## 2023-11-11 DIAGNOSIS — R002 Palpitations: Secondary | ICD-10-CM | POA: Diagnosis not present

## 2023-11-11 DIAGNOSIS — Z8673 Personal history of transient ischemic attack (TIA), and cerebral infarction without residual deficits: Secondary | ICD-10-CM | POA: Diagnosis not present

## 2023-11-11 DIAGNOSIS — I4719 Other supraventricular tachycardia: Secondary | ICD-10-CM | POA: Diagnosis not present

## 2023-11-16 DIAGNOSIS — I10 Essential (primary) hypertension: Secondary | ICD-10-CM | POA: Diagnosis not present

## 2023-11-19 DIAGNOSIS — E1165 Type 2 diabetes mellitus with hyperglycemia: Secondary | ICD-10-CM | POA: Diagnosis not present

## 2023-11-19 DIAGNOSIS — D518 Other vitamin B12 deficiency anemias: Secondary | ICD-10-CM | POA: Diagnosis not present

## 2023-11-19 DIAGNOSIS — F411 Generalized anxiety disorder: Secondary | ICD-10-CM | POA: Diagnosis not present

## 2023-11-19 DIAGNOSIS — E038 Other specified hypothyroidism: Secondary | ICD-10-CM | POA: Diagnosis not present

## 2023-11-19 DIAGNOSIS — F330 Major depressive disorder, recurrent, mild: Secondary | ICD-10-CM | POA: Diagnosis not present

## 2023-11-19 DIAGNOSIS — E782 Mixed hyperlipidemia: Secondary | ICD-10-CM | POA: Diagnosis not present

## 2023-11-19 DIAGNOSIS — E559 Vitamin D deficiency, unspecified: Secondary | ICD-10-CM | POA: Diagnosis not present

## 2023-11-19 DIAGNOSIS — M151 Heberden's nodes (with arthropathy): Secondary | ICD-10-CM | POA: Diagnosis not present

## 2023-11-19 DIAGNOSIS — M150 Primary generalized (osteo)arthritis: Secondary | ICD-10-CM | POA: Diagnosis not present

## 2023-11-19 DIAGNOSIS — I700 Atherosclerosis of aorta: Secondary | ICD-10-CM | POA: Diagnosis not present

## 2023-11-19 DIAGNOSIS — I25119 Atherosclerotic heart disease of native coronary artery with unspecified angina pectoris: Secondary | ICD-10-CM | POA: Diagnosis not present

## 2023-11-19 DIAGNOSIS — G43019 Migraine without aura, intractable, without status migrainosus: Secondary | ICD-10-CM | POA: Diagnosis not present

## 2023-11-23 DIAGNOSIS — B351 Tinea unguium: Secondary | ICD-10-CM | POA: Diagnosis not present

## 2023-11-23 DIAGNOSIS — L650 Telogen effluvium: Secondary | ICD-10-CM | POA: Diagnosis not present

## 2023-12-02 DIAGNOSIS — R252 Cramp and spasm: Secondary | ICD-10-CM | POA: Diagnosis not present

## 2023-12-02 DIAGNOSIS — E1165 Type 2 diabetes mellitus with hyperglycemia: Secondary | ICD-10-CM | POA: Diagnosis not present

## 2023-12-02 DIAGNOSIS — R110 Nausea: Secondary | ICD-10-CM | POA: Diagnosis not present

## 2023-12-06 DIAGNOSIS — F330 Major depressive disorder, recurrent, mild: Secondary | ICD-10-CM | POA: Diagnosis not present

## 2023-12-06 DIAGNOSIS — M151 Heberden's nodes (with arthropathy): Secondary | ICD-10-CM | POA: Diagnosis not present

## 2023-12-06 DIAGNOSIS — D518 Other vitamin B12 deficiency anemias: Secondary | ICD-10-CM | POA: Diagnosis not present

## 2023-12-06 DIAGNOSIS — I25119 Atherosclerotic heart disease of native coronary artery with unspecified angina pectoris: Secondary | ICD-10-CM | POA: Diagnosis not present

## 2023-12-06 DIAGNOSIS — G43019 Migraine without aura, intractable, without status migrainosus: Secondary | ICD-10-CM | POA: Diagnosis not present

## 2023-12-06 DIAGNOSIS — E782 Mixed hyperlipidemia: Secondary | ICD-10-CM | POA: Diagnosis not present

## 2023-12-06 DIAGNOSIS — M150 Primary generalized (osteo)arthritis: Secondary | ICD-10-CM | POA: Diagnosis not present

## 2023-12-06 DIAGNOSIS — I700 Atherosclerosis of aorta: Secondary | ICD-10-CM | POA: Diagnosis not present

## 2023-12-06 DIAGNOSIS — F411 Generalized anxiety disorder: Secondary | ICD-10-CM | POA: Diagnosis not present

## 2023-12-06 DIAGNOSIS — E038 Other specified hypothyroidism: Secondary | ICD-10-CM | POA: Diagnosis not present

## 2023-12-06 DIAGNOSIS — E559 Vitamin D deficiency, unspecified: Secondary | ICD-10-CM | POA: Diagnosis not present

## 2023-12-06 DIAGNOSIS — E1165 Type 2 diabetes mellitus with hyperglycemia: Secondary | ICD-10-CM | POA: Diagnosis not present

## 2023-12-10 DIAGNOSIS — F419 Anxiety disorder, unspecified: Secondary | ICD-10-CM | POA: Diagnosis not present

## 2023-12-17 DIAGNOSIS — I10 Essential (primary) hypertension: Secondary | ICD-10-CM | POA: Diagnosis not present

## 2023-12-25 ENCOUNTER — Ambulatory Visit (HOSPITAL_BASED_OUTPATIENT_CLINIC_OR_DEPARTMENT_OTHER)
Admission: EM | Admit: 2023-12-25 | Discharge: 2023-12-25 | Disposition: A | Discharge status: Home / Self Care | Attending: Family Medicine | Admitting: Family Medicine

## 2023-12-25 ENCOUNTER — Encounter (HOSPITAL_BASED_OUTPATIENT_CLINIC_OR_DEPARTMENT_OTHER): Payer: Self-pay

## 2023-12-25 DIAGNOSIS — B349 Viral infection, unspecified: Secondary | ICD-10-CM | POA: Diagnosis not present

## 2023-12-25 LAB — POC SARS CORONAVIRUS 2 AG -  ED: SARS Coronavirus 2 Ag: NEGATIVE

## 2023-12-25 NOTE — Discharge Instructions (Signed)
 Your covid test is negative. This is most likely some sort of virus. You can take OTC medications to include Mucinex, Tylenol for symptoms.  Rest, Hydrate.  See you doctor for worsening symptoms.

## 2023-12-25 NOTE — ED Triage Notes (Signed)
 Onset Thursday of cough, and ear pain, now having sore throat. Smoker 1/2ppd.

## 2023-12-25 NOTE — ED Provider Notes (Signed)
 [facility]    CSN: [id number] Arrival date & time: 12/25/23  0847      History   Chief Complaint Chief Complaint  Patient presents with   Cough   Otalgia   Sore Throat    HPI Teresa Garrison is a 68 y.o. female.    Otalgia Associated symptoms: congestion, cough and sore throat   Sore Throat  URI Presenting symptoms: congestion, cough, fatigue and sore throat   Severity:  Moderate Onset quality:  Gradual Duration:  3 days Timing:  Constant Progression:  Unchanged Chronicity:  New Relieved by:  Nothing Worsened by:  Nothing Ineffective treatments:  None tried Associated symptoms: arthralgias   Risk factors: no recent illness, no recent travel and no sick contacts     Past Medical History:  Diagnosis Date   Abdominal aortic aneurysm without rupture (HCC) 11/28/2020   Angina pectoris (HCC) 11/28/2020   Arthritis    Attention and concentration deficit 11/28/2020   Bipolar disorder (HCC)    Body mass index (BMI) 25.0-25.9, adult 03/14/2020   Cerebral infarction (HCC) 11/28/2020   Cervical stenosis of spine    Chronic neck pain    Constipation 11/28/2020   CVA (cerebral vascular accident) (HCC)    Depression    Diabetes (HCC)    Elevated blood-pressure reading, without diagnosis of hypertension 03/14/2020   Fatigue 11/28/2020   Gastro-esophageal reflux disease without esophagitis 11/28/2020   Generalized anxiety disorder 11/28/2020   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/28/2020   Hardening of the aorta (main artery of the heart) (HCC) 11/28/2020   History of stroke 11/28/2020   Hyperglycemia due to type 2 diabetes mellitus (HCC) 11/28/2020   Hyperlipidemia    Hypertension    Hypothyroidism 11/28/2020   Leukocytosis 11/28/2020   Low back pain 11/28/2020   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   Mixed hyperlipidemia 11/28/2020   Non-toxic multinodular goiter 11/28/2020   Occlusion and stenosis of bilateral carotid arteries 11/28/2020    Oral phase dysphagia 11/28/2020   Other long term (current) drug therapy 11/28/2020   Other vitamin B12 deficiency anemias 11/28/2020   Peripheral vascular disease (HCC) 11/28/2020   Pre-operative cardiovascular examination 11/28/2020   Primary generalized (osteo)arthritis 11/28/2020   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 11/28/2020   Shoulder joint painful on movement 03/25/2020   Substance use disorder    Tobacco abuse    Vitamin D deficiency 11/28/2020    Patient Active Problem List   Diagnosis Date Noted   Arthritis 11/28/2020   Bipolar disorder (HCC) 11/28/2020   Cervical stenosis of spine 11/28/2020   Chronic neck pain 11/28/2020   CVA (cerebral vascular accident) (HCC) 11/28/2020   Depression 11/28/2020   Diabetes (HCC) 11/28/2020   Hyperlipidemia 11/28/2020   Substance use disorder 11/28/2020   Tobacco abuse 11/28/2020   Hypertension 11/28/2020   Abdominal aortic aneurysm without rupture (HCC) 11/28/2020   Angina pectoris (HCC) 11/28/2020   Attention and concentration deficit 11/28/2020   Cerebral infarction (HCC) 11/28/2020   Constipation 11/28/2020   Fatigue 11/28/2020   Gastro-esophageal reflux disease without esophagitis 11/28/2020   Generalized anxiety disorder 11/28/2020   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/28/2020   Hardening of the aorta (main artery of the heart) (HCC) 11/28/2020   Hyperglycemia due to type 2 diabetes mellitus (HCC) 11/28/2020   Hypothyroidism 11/28/2020   Leukocytosis 11/28/2020   Low back pain 11/28/2020   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   Non-toxic multinodular goiter 11/28/2020   Occlusion and stenosis of bilateral carotid  arteries 11/28/2020   Oral phase dysphagia 11/28/2020   Other long term (current) drug therapy 11/28/2020   Other vitamin B12 deficiency anemias 11/28/2020   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      Home Medications    Prior to Admission medications   Medication Sig Start Date End Date Taking? Authorizing Provider  aspirin EC 81 MG tablet Take 81 mg by mouth daily.    [provider]  atomoxetine (STRATTERA) 25 MG capsule Take 25 mg by mouth daily. 09/13/20   [provider]  atorvastatin (LIPITOR) 80 MG tablet Take 80 mg by mouth daily.    [provider]  diazepam  (VALIUM ) 10 MG tablet Take 10 mg by mouth 2 (two) times daily as needed for anxiety. 09/20/20   [provider]  DULoxetine (CYMBALTA) 60 MG capsule Take 60 mg by mouth daily. 10/08/20   [provider]  ibuprofen (ADVIL) 600 MG tablet Take 600 mg by mouth every 6 (six) hours as needed for pain. 10/11/20   [provider]  metFORMIN (GLUCOPHAGE-XR) 500 MG 24 hr tablet Take 500 mg by mouth daily. 08/27/20   [provider]  pantoprazole (PROTONIX) 40 MG tablet Take 40 mg by mouth daily. 09/18/20   [provider]  traZODone (DESYREL) 50 MG tablet Take 50-75 mg by mouth at bedtime as needed for sleep. 09/13/20   [provider]  valACYclovir (VALTREX) 1000 MG tablet Take 1,000 mg by mouth daily as needed for other. Fever blisters    [provider]    Family History Family History  Problem Relation Age of Onset   Diabetes Sister    Hypertension Other    Cancer Other    Stroke Other     Social History Social History   Tobacco Use   Smoking  status: Every Day    Types: Cigarettes   Smokeless tobacco: Never     Allergies   Patient has no known allergies.   Review of Systems Review of Systems  Constitutional:  Positive for fatigue.  HENT:  Positive for congestion and sore throat.   Respiratory:  Positive for cough.   Musculoskeletal:  Positive for arthralgias.     Physical Exam Triage Vital Signs ED Triage Vitals  Encounter Vitals Group     BP 12/25/23 0906 135/77     Girls Systolic BP Percentile --      Girls Diastolic BP Percentile --      Boys Systolic BP Percentile --      Boys Diastolic BP Percentile --      Pulse Rate 12/25/23 0906 60     Resp 12/25/23 0906 20     Temp 12/25/23 0906 98.4 F (36.9 C)     Temp Source 12/25/23 0906 Oral     SpO2 12/25/23 0906 97 %     Weight --      Height --      Head  Circumference --      Peak Flow --      Pain Score 12/25/23 0907 8     Pain Loc --      Pain Education --      Exclude from Growth Chart --    No data found.  Updated Vital Signs BP 135/77 (BP Location: Right Arm)   Pulse 60   Temp 98.4 F (36.9 C) (Oral)   Resp 20   SpO2 97%   Visual Acuity Right Eye Distance:   Left Eye Distance:   Bilateral Distance:    Right Eye Near:   Left Eye Near:    Bilateral Near:     Physical Exam Constitutional:      General: She is not in acute distress.    Appearance: Normal appearance. She is not ill-appearing, toxic-appearing or diaphoretic.  HENT:     Head: Normocephalic and atraumatic.     Right Ear: Tympanic membrane and ear canal normal.     Left Ear: Tympanic membrane and ear canal normal.     Nose: Congestion and rhinorrhea present.     Mouth/Throat:     Pharynx: Oropharynx is clear.  Eyes:     Conjunctiva/sclera: Conjunctivae normal.  Cardiovascular:     Rate and Rhythm: Normal rate and regular rhythm.     Pulses: Normal pulses.     Heart sounds: Normal heart sounds.  Pulmonary:     Effort: Pulmonary effort is normal.     Breath  sounds: Normal breath sounds.  Skin:    General: Skin is warm and dry.  Neurological:     Mental Status: She is alert.  Psychiatric:        Mood and Affect: Mood normal.      UC Treatments / Results  Labs (all labs ordered are listed, but only abnormal results are displayed) Labs Reviewed  POC SARS CORONAVIRUS 2 AG -  ED    EKG   Radiology No results found.  Procedures Procedures (including critical care time)  Medications Ordered in UC Medications - No data to display  Initial Impression / Assessment and Plan / UC Course  I have reviewed the triage vital signs and the nursing notes.  Pertinent labs & imaging results that were available during my care of the patient were reviewed by me and considered in my medical decision making (see chart for details).     Viral illness-no concerns on exam.  Recommend symptomatic treatment for relief of symptoms. Rest, hydrate and follow-up as needed  Final Clinical Impressions(s) / UC Diagnoses   Final diagnoses:  Viral illness     Discharge Instructions      Your covid test is negative. This is most likely some sort of virus. You can take OTC medications to include Mucinex, Tylenol for symptoms.  Rest, Hydrate.  See you doctor for worsening symptoms.    ED Prescriptions   None    PDMP not reviewed this encounter.   Adah Wilbert LABOR, FNP 12/25/23 [phone]

## 2024-01-11 DIAGNOSIS — F419 Anxiety disorder, unspecified: Secondary | ICD-10-CM | POA: Diagnosis not present

## 2024-01-12 DIAGNOSIS — H9201 Otalgia, right ear: Secondary | ICD-10-CM | POA: Diagnosis not present

## 2024-01-12 DIAGNOSIS — R110 Nausea: Secondary | ICD-10-CM | POA: Diagnosis not present

## 2024-01-16 DIAGNOSIS — I10 Essential (primary) hypertension: Secondary | ICD-10-CM | POA: Diagnosis not present

## 2024-01-19 DIAGNOSIS — R9431 Abnormal electrocardiogram [ECG] [EKG]: Secondary | ICD-10-CM | POA: Diagnosis not present

## 2024-01-19 DIAGNOSIS — Z720 Tobacco use: Secondary | ICD-10-CM | POA: Diagnosis not present

## 2024-01-19 DIAGNOSIS — R5383 Other fatigue: Secondary | ICD-10-CM | POA: Diagnosis not present

## 2024-01-19 DIAGNOSIS — E1159 Type 2 diabetes mellitus with other circulatory complications: Secondary | ICD-10-CM | POA: Diagnosis not present

## 2024-01-19 DIAGNOSIS — R002 Palpitations: Secondary | ICD-10-CM | POA: Diagnosis not present

## 2024-01-20 DIAGNOSIS — I639 Cerebral infarction, unspecified: Secondary | ICD-10-CM | POA: Diagnosis not present

## 2024-01-20 DIAGNOSIS — J440 Chronic obstructive pulmonary disease with acute lower respiratory infection: Secondary | ICD-10-CM | POA: Diagnosis not present

## 2024-01-20 DIAGNOSIS — M150 Primary generalized (osteo)arthritis: Secondary | ICD-10-CM | POA: Diagnosis not present

## 2024-01-20 DIAGNOSIS — I25119 Atherosclerotic heart disease of native coronary artery with unspecified angina pectoris: Secondary | ICD-10-CM | POA: Diagnosis not present

## 2024-01-20 DIAGNOSIS — E782 Mixed hyperlipidemia: Secondary | ICD-10-CM | POA: Diagnosis not present

## 2024-01-20 DIAGNOSIS — E559 Vitamin D deficiency, unspecified: Secondary | ICD-10-CM | POA: Diagnosis not present

## 2024-01-20 DIAGNOSIS — E781 Pure hyperglyceridemia: Secondary | ICD-10-CM | POA: Diagnosis not present

## 2024-01-20 DIAGNOSIS — M151 Heberden's nodes (with arthropathy): Secondary | ICD-10-CM | POA: Diagnosis not present

## 2024-01-20 DIAGNOSIS — E1165 Type 2 diabetes mellitus with hyperglycemia: Secondary | ICD-10-CM | POA: Diagnosis not present

## 2024-01-20 DIAGNOSIS — F330 Major depressive disorder, recurrent, mild: Secondary | ICD-10-CM | POA: Diagnosis not present

## 2024-01-20 DIAGNOSIS — E038 Other specified hypothyroidism: Secondary | ICD-10-CM | POA: Diagnosis not present

## 2024-01-20 DIAGNOSIS — D518 Other vitamin B12 deficiency anemias: Secondary | ICD-10-CM | POA: Diagnosis not present

## 2024-01-24 DIAGNOSIS — R9431 Abnormal electrocardiogram [ECG] [EKG]: Secondary | ICD-10-CM | POA: Diagnosis not present

## 2024-02-09 DIAGNOSIS — J440 Chronic obstructive pulmonary disease with acute lower respiratory infection: Secondary | ICD-10-CM | POA: Diagnosis not present

## 2024-02-09 DIAGNOSIS — F330 Major depressive disorder, recurrent, mild: Secondary | ICD-10-CM | POA: Diagnosis not present

## 2024-02-09 DIAGNOSIS — I639 Cerebral infarction, unspecified: Secondary | ICD-10-CM | POA: Diagnosis not present

## 2024-02-09 DIAGNOSIS — M150 Primary generalized (osteo)arthritis: Secondary | ICD-10-CM | POA: Diagnosis not present

## 2024-02-09 DIAGNOSIS — E559 Vitamin D deficiency, unspecified: Secondary | ICD-10-CM | POA: Diagnosis not present

## 2024-02-09 DIAGNOSIS — M151 Heberden's nodes (with arthropathy): Secondary | ICD-10-CM | POA: Diagnosis not present

## 2024-02-09 DIAGNOSIS — D518 Other vitamin B12 deficiency anemias: Secondary | ICD-10-CM | POA: Diagnosis not present

## 2024-02-09 DIAGNOSIS — E781 Pure hyperglyceridemia: Secondary | ICD-10-CM | POA: Diagnosis not present

## 2024-02-09 DIAGNOSIS — E1165 Type 2 diabetes mellitus with hyperglycemia: Secondary | ICD-10-CM | POA: Diagnosis not present

## 2024-02-09 DIAGNOSIS — I25119 Atherosclerotic heart disease of native coronary artery with unspecified angina pectoris: Secondary | ICD-10-CM | POA: Diagnosis not present

## 2024-02-09 DIAGNOSIS — I517 Cardiomegaly: Secondary | ICD-10-CM | POA: Diagnosis not present

## 2024-02-09 DIAGNOSIS — E782 Mixed hyperlipidemia: Secondary | ICD-10-CM | POA: Diagnosis not present

## 2024-02-09 DIAGNOSIS — E038 Other specified hypothyroidism: Secondary | ICD-10-CM | POA: Diagnosis not present

## 2024-02-11 DIAGNOSIS — F419 Anxiety disorder, unspecified: Secondary | ICD-10-CM | POA: Diagnosis not present

## 2024-02-16 DIAGNOSIS — Z79899 Other long term (current) drug therapy: Secondary | ICD-10-CM | POA: Diagnosis not present

## 2024-02-16 DIAGNOSIS — K219 Gastro-esophageal reflux disease without esophagitis: Secondary | ICD-10-CM | POA: Diagnosis not present

## 2024-02-16 DIAGNOSIS — E1165 Type 2 diabetes mellitus with hyperglycemia: Secondary | ICD-10-CM | POA: Diagnosis not present

## 2024-02-16 DIAGNOSIS — F330 Major depressive disorder, recurrent, mild: Secondary | ICD-10-CM | POA: Diagnosis not present

## 2024-02-16 DIAGNOSIS — D519 Vitamin B12 deficiency anemia, unspecified: Secondary | ICD-10-CM | POA: Diagnosis not present

## 2024-02-16 DIAGNOSIS — F411 Generalized anxiety disorder: Secondary | ICD-10-CM | POA: Diagnosis not present

## 2024-02-16 DIAGNOSIS — M150 Primary generalized (osteo)arthritis: Secondary | ICD-10-CM | POA: Diagnosis not present

## 2024-02-16 DIAGNOSIS — G2581 Restless legs syndrome: Secondary | ICD-10-CM | POA: Diagnosis not present

## 2024-02-16 DIAGNOSIS — E079 Disorder of thyroid, unspecified: Secondary | ICD-10-CM | POA: Diagnosis not present

## 2024-02-16 DIAGNOSIS — E559 Vitamin D deficiency, unspecified: Secondary | ICD-10-CM | POA: Diagnosis not present

## 2024-02-16 DIAGNOSIS — G4700 Insomnia, unspecified: Secondary | ICD-10-CM | POA: Diagnosis not present

## 2024-02-16 DIAGNOSIS — E782 Mixed hyperlipidemia: Secondary | ICD-10-CM | POA: Diagnosis not present

## 2024-02-17 DIAGNOSIS — I10 Essential (primary) hypertension: Secondary | ICD-10-CM | POA: Diagnosis not present

## 2024-02-23 DIAGNOSIS — D485 Neoplasm of uncertain behavior of skin: Secondary | ICD-10-CM | POA: Diagnosis not present

## 2024-02-23 DIAGNOSIS — L650 Telogen effluvium: Secondary | ICD-10-CM | POA: Diagnosis not present

## 2024-02-24 DIAGNOSIS — D518 Other vitamin B12 deficiency anemias: Secondary | ICD-10-CM | POA: Diagnosis not present

## 2024-02-24 DIAGNOSIS — G43019 Migraine without aura, intractable, without status migrainosus: Secondary | ICD-10-CM | POA: Diagnosis not present

## 2024-02-24 DIAGNOSIS — E559 Vitamin D deficiency, unspecified: Secondary | ICD-10-CM | POA: Diagnosis not present

## 2024-02-24 DIAGNOSIS — F330 Major depressive disorder, recurrent, mild: Secondary | ICD-10-CM | POA: Diagnosis not present

## 2024-02-24 DIAGNOSIS — E038 Other specified hypothyroidism: Secondary | ICD-10-CM | POA: Diagnosis not present

## 2024-02-24 DIAGNOSIS — E782 Mixed hyperlipidemia: Secondary | ICD-10-CM | POA: Diagnosis not present

## 2024-02-24 DIAGNOSIS — I739 Peripheral vascular disease, unspecified: Secondary | ICD-10-CM | POA: Diagnosis not present

## 2024-02-24 DIAGNOSIS — M150 Primary generalized (osteo)arthritis: Secondary | ICD-10-CM | POA: Diagnosis not present

## 2024-02-24 DIAGNOSIS — F411 Generalized anxiety disorder: Secondary | ICD-10-CM | POA: Diagnosis not present

## 2024-02-24 DIAGNOSIS — M151 Heberden's nodes (with arthropathy): Secondary | ICD-10-CM | POA: Diagnosis not present

## 2024-02-24 DIAGNOSIS — E1165 Type 2 diabetes mellitus with hyperglycemia: Secondary | ICD-10-CM | POA: Diagnosis not present

## 2024-02-24 DIAGNOSIS — I25119 Atherosclerotic heart disease of native coronary artery with unspecified angina pectoris: Secondary | ICD-10-CM | POA: Diagnosis not present

## 2024-03-13 DIAGNOSIS — F419 Anxiety disorder, unspecified: Secondary | ICD-10-CM | POA: Diagnosis not present

## 2024-03-19 DIAGNOSIS — I10 Essential (primary) hypertension: Secondary | ICD-10-CM | POA: Diagnosis not present

## 2024-04-12 DIAGNOSIS — F419 Anxiety disorder, unspecified: Secondary | ICD-10-CM | POA: Diagnosis not present

## 2024-04-18 DIAGNOSIS — I10 Essential (primary) hypertension: Secondary | ICD-10-CM | POA: Diagnosis not present

## 2024-04-20 DIAGNOSIS — E1159 Type 2 diabetes mellitus with other circulatory complications: Secondary | ICD-10-CM | POA: Diagnosis not present

## 2024-04-20 DIAGNOSIS — K219 Gastro-esophageal reflux disease without esophagitis: Secondary | ICD-10-CM | POA: Diagnosis not present

## 2024-04-20 DIAGNOSIS — R5383 Other fatigue: Secondary | ICD-10-CM | POA: Diagnosis not present

## 2024-04-20 DIAGNOSIS — R9431 Abnormal electrocardiogram [ECG] [EKG]: Secondary | ICD-10-CM | POA: Diagnosis not present

## 2024-04-20 DIAGNOSIS — G4700 Insomnia, unspecified: Secondary | ICD-10-CM | POA: Diagnosis not present

## 2024-04-20 DIAGNOSIS — R002 Palpitations: Secondary | ICD-10-CM | POA: Diagnosis not present

## 2024-04-20 DIAGNOSIS — G2581 Restless legs syndrome: Secondary | ICD-10-CM | POA: Diagnosis not present

## 2024-04-20 DIAGNOSIS — D519 Vitamin B12 deficiency anemia, unspecified: Secondary | ICD-10-CM | POA: Diagnosis not present

## 2024-04-20 DIAGNOSIS — D518 Other vitamin B12 deficiency anemias: Secondary | ICD-10-CM | POA: Diagnosis not present

## 2024-04-20 DIAGNOSIS — M150 Primary generalized (osteo)arthritis: Secondary | ICD-10-CM | POA: Diagnosis not present

## 2024-04-20 DIAGNOSIS — R110 Nausea: Secondary | ICD-10-CM | POA: Diagnosis not present

## 2024-04-20 DIAGNOSIS — E559 Vitamin D deficiency, unspecified: Secondary | ICD-10-CM | POA: Diagnosis not present

## 2024-04-20 DIAGNOSIS — E1165 Type 2 diabetes mellitus with hyperglycemia: Secondary | ICD-10-CM | POA: Diagnosis not present

## 2024-04-21 DIAGNOSIS — E559 Vitamin D deficiency, unspecified: Secondary | ICD-10-CM | POA: Diagnosis not present

## 2024-04-21 DIAGNOSIS — F330 Major depressive disorder, recurrent, mild: Secondary | ICD-10-CM | POA: Diagnosis not present

## 2024-04-21 DIAGNOSIS — D518 Other vitamin B12 deficiency anemias: Secondary | ICD-10-CM | POA: Diagnosis not present

## 2024-04-21 DIAGNOSIS — I739 Peripheral vascular disease, unspecified: Secondary | ICD-10-CM | POA: Diagnosis not present

## 2024-04-21 DIAGNOSIS — M150 Primary generalized (osteo)arthritis: Secondary | ICD-10-CM | POA: Diagnosis not present

## 2024-04-21 DIAGNOSIS — M151 Heberden's nodes (with arthropathy): Secondary | ICD-10-CM | POA: Diagnosis not present

## 2024-04-21 DIAGNOSIS — E038 Other specified hypothyroidism: Secondary | ICD-10-CM | POA: Diagnosis not present

## 2024-04-21 DIAGNOSIS — G43019 Migraine without aura, intractable, without status migrainosus: Secondary | ICD-10-CM | POA: Diagnosis not present

## 2024-04-21 DIAGNOSIS — F411 Generalized anxiety disorder: Secondary | ICD-10-CM | POA: Diagnosis not present

## 2024-04-21 DIAGNOSIS — I25119 Atherosclerotic heart disease of native coronary artery with unspecified angina pectoris: Secondary | ICD-10-CM | POA: Diagnosis not present

## 2024-04-21 DIAGNOSIS — E782 Mixed hyperlipidemia: Secondary | ICD-10-CM | POA: Diagnosis not present

## 2024-04-21 DIAGNOSIS — E1165 Type 2 diabetes mellitus with hyperglycemia: Secondary | ICD-10-CM | POA: Diagnosis not present

## 2024-05-01 DIAGNOSIS — M79631 Pain in right forearm: Secondary | ICD-10-CM | POA: Diagnosis not present

## 2024-05-01 DIAGNOSIS — M79641 Pain in right hand: Secondary | ICD-10-CM | POA: Diagnosis not present

## 2024-05-01 DIAGNOSIS — M25532 Pain in left wrist: Secondary | ICD-10-CM | POA: Diagnosis not present

## 2024-05-01 DIAGNOSIS — M25531 Pain in right wrist: Secondary | ICD-10-CM | POA: Diagnosis not present

## 2024-05-12 DIAGNOSIS — F411 Generalized anxiety disorder: Secondary | ICD-10-CM | POA: Diagnosis not present

## 2024-05-12 DIAGNOSIS — E038 Other specified hypothyroidism: Secondary | ICD-10-CM | POA: Diagnosis not present

## 2024-05-12 DIAGNOSIS — I639 Cerebral infarction, unspecified: Secondary | ICD-10-CM | POA: Diagnosis not present

## 2024-05-12 DIAGNOSIS — I25119 Atherosclerotic heart disease of native coronary artery with unspecified angina pectoris: Secondary | ICD-10-CM | POA: Diagnosis not present

## 2024-05-12 DIAGNOSIS — E781 Pure hyperglyceridemia: Secondary | ICD-10-CM | POA: Diagnosis not present

## 2024-05-12 DIAGNOSIS — D519 Vitamin B12 deficiency anemia, unspecified: Secondary | ICD-10-CM | POA: Diagnosis not present

## 2024-05-12 DIAGNOSIS — G43019 Migraine without aura, intractable, without status migrainosus: Secondary | ICD-10-CM | POA: Diagnosis not present

## 2024-05-12 DIAGNOSIS — E1165 Type 2 diabetes mellitus with hyperglycemia: Secondary | ICD-10-CM | POA: Diagnosis not present

## 2024-05-12 DIAGNOSIS — I5022 Chronic systolic (congestive) heart failure: Secondary | ICD-10-CM | POA: Diagnosis not present

## 2024-05-12 DIAGNOSIS — E559 Vitamin D deficiency, unspecified: Secondary | ICD-10-CM | POA: Diagnosis not present

## 2024-05-12 DIAGNOSIS — E782 Mixed hyperlipidemia: Secondary | ICD-10-CM | POA: Diagnosis not present

## 2024-05-12 DIAGNOSIS — F330 Major depressive disorder, recurrent, mild: Secondary | ICD-10-CM | POA: Diagnosis not present

## 2024-05-15 DIAGNOSIS — F419 Anxiety disorder, unspecified: Secondary | ICD-10-CM | POA: Diagnosis not present

## 2024-05-19 DIAGNOSIS — I10 Essential (primary) hypertension: Secondary | ICD-10-CM | POA: Diagnosis not present

## 2024-05-24 DIAGNOSIS — M545 Low back pain, unspecified: Secondary | ICD-10-CM | POA: Diagnosis not present

## 2024-05-24 DIAGNOSIS — E1165 Type 2 diabetes mellitus with hyperglycemia: Secondary | ICD-10-CM | POA: Diagnosis not present

## 2024-05-24 DIAGNOSIS — F411 Generalized anxiety disorder: Secondary | ICD-10-CM | POA: Diagnosis not present

## 2024-05-24 DIAGNOSIS — D519 Vitamin B12 deficiency anemia, unspecified: Secondary | ICD-10-CM | POA: Diagnosis not present

## 2024-05-24 DIAGNOSIS — M25552 Pain in left hip: Secondary | ICD-10-CM | POA: Diagnosis not present

## 2024-05-24 DIAGNOSIS — K219 Gastro-esophageal reflux disease without esophagitis: Secondary | ICD-10-CM | POA: Diagnosis not present

## 2024-05-24 DIAGNOSIS — E559 Vitamin D deficiency, unspecified: Secondary | ICD-10-CM | POA: Diagnosis not present

## 2024-05-24 DIAGNOSIS — Z23 Encounter for immunization: Secondary | ICD-10-CM | POA: Diagnosis not present

## 2024-05-24 DIAGNOSIS — E782 Mixed hyperlipidemia: Secondary | ICD-10-CM | POA: Diagnosis not present

## 2024-05-24 DIAGNOSIS — M150 Primary generalized (osteo)arthritis: Secondary | ICD-10-CM | POA: Diagnosis not present

## 2024-05-24 DIAGNOSIS — G4700 Insomnia, unspecified: Secondary | ICD-10-CM | POA: Diagnosis not present

## 2024-05-24 DIAGNOSIS — M25551 Pain in right hip: Secondary | ICD-10-CM | POA: Diagnosis not present

## 2024-07-04 NOTE — Progress Notes (Signed)
 Teresa Garrison                                          MRN: [id number]   07/04/2024   The [facility] Quality Team Specialist reviewed this patient medical record for the purposes of chart review for care gap closure. The following were reviewed: abstraction for care gap closure-controlling blood pressure.    [facility] Quality Team
